# Patient Record
Sex: Male | Born: 1955 | Race: White | Hispanic: No | Marital: Married | Smoking: Never smoker
Health system: Southern US, Community
[De-identification: ages and names within clinical notes are randomized; demographics above are authoritative.]

## PROBLEM LIST (undated history)

## (undated) DIAGNOSIS — C4491 Basal cell carcinoma of skin, unspecified: Secondary | ICD-10-CM

## (undated) DIAGNOSIS — K635 Polyp of colon: Secondary | ICD-10-CM

## (undated) DIAGNOSIS — R9431 Abnormal electrocardiogram [ECG] [EKG]: Secondary | ICD-10-CM

## (undated) HISTORY — PX: VASECTOMY: SHX75

## (undated) HISTORY — PX: COLONOSCOPY: SHX174

## (undated) HISTORY — PX: KNEE ARTHROSCOPY: SUR90

## (undated) HISTORY — DX: Abnormal electrocardiogram (ECG) (EKG): R94.31

## (undated) HISTORY — DX: Polyp of colon: K63.5

## (undated) HISTORY — PX: HERNIA REPAIR: SHX51

## (undated) HISTORY — DX: Basal cell carcinoma of skin, unspecified: C44.91

---

## 1999-01-25 ENCOUNTER — Encounter: Payer: Self-pay | Admitting: Sports Medicine

## 1999-01-25 ENCOUNTER — Ambulatory Visit (HOSPITAL_COMMUNITY): Admission: RE | Admit: 1999-01-25 | Discharge: 1999-01-25 | Payer: Self-pay | Admitting: Sports Medicine

## 2006-06-07 ENCOUNTER — Ambulatory Visit: Payer: Self-pay | Admitting: Gastroenterology

## 2006-06-14 ENCOUNTER — Encounter: Payer: Self-pay | Admitting: Gastroenterology

## 2006-06-14 ENCOUNTER — Ambulatory Visit: Payer: Self-pay | Admitting: Gastroenterology

## 2006-06-29 ENCOUNTER — Ambulatory Visit: Payer: Self-pay | Admitting: Gastroenterology

## 2006-08-16 ENCOUNTER — Ambulatory Visit: Payer: Self-pay | Admitting: Family Medicine

## 2006-12-01 ENCOUNTER — Ambulatory Visit: Payer: Self-pay | Admitting: Gastroenterology

## 2006-12-14 ENCOUNTER — Ambulatory Visit: Payer: Self-pay | Admitting: Gastroenterology

## 2006-12-14 ENCOUNTER — Encounter (INDEPENDENT_AMBULATORY_CARE_PROVIDER_SITE_OTHER): Payer: Self-pay | Admitting: *Deleted

## 2006-12-14 LAB — HM COLONOSCOPY

## 2007-09-15 ENCOUNTER — Ambulatory Visit: Payer: Self-pay | Admitting: Family Medicine

## 2007-11-30 ENCOUNTER — Ambulatory Visit: Payer: Self-pay | Admitting: Internal Medicine

## 2007-11-30 DIAGNOSIS — Z9889 Other specified postprocedural states: Secondary | ICD-10-CM

## 2007-12-30 ENCOUNTER — Ambulatory Visit: Payer: Self-pay | Admitting: Internal Medicine

## 2008-01-04 LAB — CONVERTED CEMR LAB
ALT: 15 units/L (ref 0–53)
AST: 22 units/L (ref 0–37)
Albumin: 4.1 g/dL (ref 3.5–5.2)
Alkaline Phosphatase: 68 units/L (ref 39–117)
BUN: 12 mg/dL (ref 6–23)
Basophils Absolute: 0 10*3/uL (ref 0.0–0.1)
Basophils Relative: 0.7 % (ref 0.0–1.0)
Bilirubin, Direct: 0.1 mg/dL (ref 0.0–0.3)
CO2: 31 meq/L (ref 19–32)
Calcium: 9.4 mg/dL (ref 8.4–10.5)
Chloride: 107 meq/L (ref 96–112)
Cholesterol: 162 mg/dL (ref 0–200)
Creatinine, Ser: 0.9 mg/dL (ref 0.4–1.5)
Eosinophils Absolute: 0.1 10*3/uL (ref 0.0–0.6)
Eosinophils Relative: 2.5 % (ref 0.0–5.0)
GFR calc Af Amer: 114 mL/min
GFR calc non Af Amer: 95 mL/min
Glucose, Bld: 100 mg/dL — ABNORMAL HIGH (ref 70–99)
HCT: 46 % (ref 39.0–52.0)
HDL: 39.2 mg/dL (ref >39.0)
Hemoglobin: 15 g/dL (ref 13.0–17.0)
LDL Cholesterol: 111 mg/dL — ABNORMAL HIGH (ref 0–99)
Lymphocytes Relative: 49.2 % — ABNORMAL HIGH (ref 12.0–46.0)
MCHC: 32.5 g/dL (ref 30.0–36.0)
MCV: 97.1 fL (ref 78.0–100.0)
Monocytes Absolute: 0.4 10*3/uL (ref 0.2–0.7)
Monocytes Relative: 8.1 % (ref 3.0–11.0)
Neutro Abs: 1.9 10*3/uL (ref 1.4–7.7)
Neutrophils Relative %: 39.5 % — ABNORMAL LOW (ref 43.0–77.0)
PSA: 0.38 ng/mL (ref 0.10–4.00)
Platelets: 156 10*3/uL (ref 150–400)
Potassium: 4.5 meq/L (ref 3.5–5.1)
RBC: 4.74 M/uL (ref 4.22–5.81)
RDW: 12.4 % (ref 11.5–14.6)
Sodium: 142 meq/L (ref 135–145)
TSH: 2.08 microintl units/mL (ref 0.35–5.50)
Total Bilirubin: 0.8 mg/dL (ref 0.3–1.2)
Total CHOL/HDL Ratio: 4.1
Total Protein: 6.5 g/dL (ref 6.0–8.3)
Triglycerides: 58 mg/dL (ref 0–149)
VLDL: 12 mg/dL (ref 0–40)
WBC: 4.8 10*3/uL (ref 4.5–10.5)

## 2008-05-28 ENCOUNTER — Ambulatory Visit: Payer: Self-pay | Admitting: Internal Medicine

## 2008-05-28 DIAGNOSIS — H9209 Otalgia, unspecified ear: Secondary | ICD-10-CM | POA: Insufficient documentation

## 2008-06-04 ENCOUNTER — Telehealth (INDEPENDENT_AMBULATORY_CARE_PROVIDER_SITE_OTHER): Payer: Self-pay | Admitting: *Deleted

## 2009-01-29 ENCOUNTER — Ambulatory Visit: Payer: Self-pay | Admitting: Internal Medicine

## 2009-02-25 ENCOUNTER — Telehealth (INDEPENDENT_AMBULATORY_CARE_PROVIDER_SITE_OTHER): Payer: Self-pay | Admitting: *Deleted

## 2009-02-28 ENCOUNTER — Ambulatory Visit: Payer: Self-pay | Admitting: Internal Medicine

## 2009-08-23 ENCOUNTER — Ambulatory Visit: Payer: Self-pay | Admitting: Internal Medicine

## 2010-01-03 LAB — CONVERTED CEMR LAB
Blood Glucose, Fasting: 90 mg/dL
Cholesterol: 194 mg/dL
HDL: 47 mg/dL
Hgb A1c MFr Bld: 5.4 %
LDL Cholesterol: 134 mg/dL
Triglyceride fasting, serum: 65 mg/dL

## 2010-01-06 ENCOUNTER — Ambulatory Visit: Payer: Self-pay | Admitting: Internal Medicine

## 2010-01-13 LAB — CONVERTED CEMR LAB
ALT: 18 units/L (ref 0–53)
AST: 25 units/L (ref 0–37)
Albumin: 4.2 g/dL (ref 3.5–5.2)
Alkaline Phosphatase: 71 units/L (ref 39–117)
BUN: 14 mg/dL (ref 6–23)
Basophils Absolute: 0.2 10*3/uL — ABNORMAL HIGH (ref 0.0–0.1)
Basophils Relative: 3.5 % — ABNORMAL HIGH (ref 0.0–3.0)
Bilirubin, Direct: 0.1 mg/dL (ref 0.0–0.3)
CO2: 32 meq/L (ref 19–32)
Calcium: 9.4 mg/dL (ref 8.4–10.5)
Chloride: 106 meq/L (ref 96–112)
Creatinine, Ser: 1 mg/dL (ref 0.4–1.5)
Eosinophils Absolute: 0.2 10*3/uL (ref 0.0–0.7)
Eosinophils Relative: 3.4 % (ref 0.0–5.0)
GFR calc non Af Amer: 82.84 mL/min (ref >60)
Glucose, Bld: 80 mg/dL (ref 70–99)
HCT: 44.7 % (ref 39.0–52.0)
Hemoglobin: 14.8 g/dL (ref 13.0–17.0)
Lymphocytes Relative: 49.1 % — ABNORMAL HIGH (ref 12.0–46.0)
Lymphs Abs: 2.2 10*3/uL (ref 0.7–4.0)
MCHC: 33.2 g/dL (ref 30.0–36.0)
MCV: 97.9 fL (ref 78.0–100.0)
Monocytes Absolute: 0.9 10*3/uL (ref 0.1–1.0)
Monocytes Relative: 19.1 % — ABNORMAL HIGH (ref 3.0–12.0)
Neutro Abs: 1.2 10*3/uL — ABNORMAL LOW (ref 1.4–7.7)
Neutrophils Relative %: 24.9 % — ABNORMAL LOW (ref 43.0–77.0)
PSA: 0.44 ng/mL (ref 0.10–4.00)
Platelets: 148 10*3/uL — ABNORMAL LOW (ref 150.0–400.0)
Potassium: 4.4 meq/L (ref 3.5–5.1)
RBC: 4.56 M/uL (ref 4.22–5.81)
RDW: 12.5 % (ref 11.5–14.6)
Sodium: 142 meq/L (ref 135–145)
TSH: 1.49 microintl units/mL (ref 0.35–5.50)
Total Bilirubin: 0.7 mg/dL (ref 0.3–1.2)
Total Protein: 6.2 g/dL (ref 6.0–8.3)
WBC: 4.7 10*3/uL (ref 4.5–10.5)

## 2010-11-25 NOTE — Assessment & Plan Note (Signed)
Summary: CPX///SPH   Vital Signs:  Patient profile:   55 year old male Height:      70.25 inches Weight:      188.8 pounds BMI:     26.99 Pulse rate:   70 / minute BP sitting:   130 / 80  Vitals Entered By: Shary Decamp (January 06, 2010 2:25 PM) CC: cpx - not fasting, had labs drawn @ work last week, bp last week 122/71   History of Present Illness: CPX  Preventive Screening-Counseling & Management  Alcohol-Tobacco     Alcohol type: beer  Caffeine-Diet-Exercise     Caffeine use/day: 3+     Does Patient Exercise: yes     Times/week: 4      Drug Use:  no.    Current Medications (verified): 1)  None  Allergies (verified): 1)  ! Pcn  Past History:  Past Medical History: Reviewed history from 11/30/2007 and no changes required. colon polyps  Past Surgical History: HERNIORRHAPHY, HX OF (ICD-V45.89) ARTHROSCOPY, LEFT KNEE, HX OF -- age 31  Family History: CAD - no HTN - no stroke - no DM - no colon ca - no prostate ca - F (dx age in his early 48s) Mother 93, father 27 very active and healthy  Social History: Never Smoked Alcohol use-no Married 3 kids works for [employer] exercise-- gym, spinning x 5/week, swimms, runs mini-marathons  diet-- healthy Does Patient Exercise:  yes Drug Use:  no Caffeine use/day:  3+  Review of Systems General:  Denies fever; slightly  wt gain. CV:  Denies chest pain or discomfort and swelling of feet. Resp:  Denies cough and shortness of breath. GI:  Denies bloody stools. GU:  Denies dysuria, hematuria, and urinary hesitancy. Psych:  Denies anxiety and depression.  Physical Exam  General:  alert, well-developed, and well-nourished.   Neck:  no masses, no thyromegaly, and normal carotid upstroke.   Lungs:  normal respiratory effort, no intercostal retractions, no accessory muscle use, and normal breath sounds.   Heart:  normal rate, regular rhythm, no murmur, and no gallop.   Abdomen:  soft, non-tender, no distention, no  masses, no guarding, and no rigidity.   Rectal:  No external abnormalities noted. Normal sphincter tone. No rectal masses or tenderness. Prostate:  Prostate gland firm and smooth, no enlargement, nodularity, tenderness, mass, asymmetry or induration. Extremities:  no pretibial edema bilaterally  Psych:  Oriented X3, memory intact for recent and remote, normally interactive, good eye contact, not anxious appearing, and not depressed appearing.     Impression & Recommendations:  Problem # 1:  HEALTH SCREENING (ICD-V70.0) T d 2005 cont healthy life style!  FLP from work reviewed Cscope-- 2-08, polyp , Bx: no adenomatous changes      Orders: Venipuncture (16109) TLB-BMP (Basic Metabolic Panel-BMET) (80048-METABOL) TLB-CBC Platelet - w/Differential (85025-CBCD) TLB-TSH (Thyroid Stimulating Hormone) (84443-TSH) TLB-PSA (Prostate Specific Antigen) (84153-PSA) TLB-Hepatic/Liver Function Pnl (80076-HEPATIC)  Patient Instructions: 1)  Please schedule a follow-up appointment in 1 year.    Preventive Care Screening  Prior Values:    PSA:  0.38 (12/30/2007)    Colonoscopy:  polyp (12/14/2006)    Last Tetanus Booster:  Td (09/20/2004)    Risk Factors:  Tobacco use:  never Drug use:  no Caffeine use:  3+ drinks per day Alcohol use:  yes    Type:  beer    Comments:  3-4 beers a week Exercise:  yes    Times per week:  4  Colonoscopy History:  Date of Last Colonoscopy:  12/14/2006    Lipid Panel Test Date: 01/03/2010                        Value        Units        H/L   Reference  Cholesterol:          194          mg/dL              (161-096) LDL Cholesterol:      134          mg/dL              (04-540) HDL Cholesterol:      47           mg/dL              (98-11) Triglyceride:         65           mg/dL              (91-478) GNFA2Z                5.4                              Lab name:             labs drawn @ work      Chemistry Labs Test Date:  01/03/2010                      Value Units        H/L   Reference  Glucose-fasting:    90    mg/dL              (30-865)  Lab name:           drawn @ work

## 2011-03-13 NOTE — Assessment & Plan Note (Signed)
[facility]                           GASTROENTEROLOGY OFFICE NOTE   NAME:Christian Mcgrath, Christian Mcgrath                    MRN:          [id number]  DATE:06/29/2006                            DOB:          18-Mar-1956    [name] returns today for review of his colon biopsy.  He had some nodules in  his rectum removed, one of which showed a small carcinoid tumor.   I have spoken with him about the benign nature of small, localized rectal  carcinoids and will repeat his flexible sigmoidoscopy with sedation in  January.  He otherwise has no GI problems or issues and is followed closely  by Dr. Blossom Hoops.                                   Vania Rea. Jarold Motto, MD, [facility], [place]   DRP/MedQ  DD:  06/29/2006  DT:  06/30/2006  Job #:  [id number]   cc:   Leanne Chang, M.D.

## 2011-06-25 ENCOUNTER — Encounter: Payer: Self-pay | Admitting: *Deleted

## 2011-06-26 ENCOUNTER — Encounter: Payer: Self-pay | Admitting: Internal Medicine

## 2011-06-26 ENCOUNTER — Other Ambulatory Visit: Payer: Self-pay | Admitting: Internal Medicine

## 2011-06-26 ENCOUNTER — Ambulatory Visit (INDEPENDENT_AMBULATORY_CARE_PROVIDER_SITE_OTHER): Payer: BC Managed Care – PPO | Admitting: Internal Medicine

## 2011-06-26 DIAGNOSIS — Z0000 Encounter for general adult medical examination without abnormal findings: Secondary | ICD-10-CM | POA: Insufficient documentation

## 2011-06-26 NOTE — Progress Notes (Signed)
  Subjective:    Patient ID: Christian Mcgrath, male    DOB: [birth year], 55 y.o.   MRN: [id number]  HPI  CPX  Past Medical History  Diagnosis Date  . Colon polyps   . BCC (basal cell carcinoma of skin)     06-2010   Past Surgical History  Procedure Date  . Hernia repair     LEFT  . Knee arthroscopy     Left, age 16     Family History: CAD - no HTN - no stroke - no DM - no colon ca - no prostate ca - F (dx age in his early 82s) Mother 31, father 29 very active and healthy  (live in [place], play tennis, snow sky)  Social History: Never Smoked Alcohol use-socially  Married, 3 kids works for [employer] exercise-- gym, spinning x 5/week, swimms, runs, thriatlon diet-- healthy  Drug Use:  no Caffeine use/day:  3+  Review of Systems  Constitutional: Negative for fever, fatigue and unexpected weight change.  Respiratory: Negative for cough and shortness of breath.   Cardiovascular: Negative for chest pain and leg swelling.  Gastrointestinal: Negative for abdominal pain and blood in stool.  Genitourinary: Negative for dysuria, hematuria and difficulty urinating.  Psychiatric/Behavioral:       No depression, anxiety       Objective:   Physical Exam  Constitutional: He is oriented to person, place, and time. He appears well-developed and well-nourished. No distress.  HENT:  Head: Normocephalic and atraumatic.  Neck: No thyromegaly present.       Nl carotid pulses   Cardiovascular: Normal rate, regular rhythm and normal heart sounds.   No murmur heard. Pulmonary/Chest: Effort normal and breath sounds normal. No respiratory distress. He has no wheezes. He has no rales.  Abdominal: Soft. Bowel sounds are normal. He exhibits no distension. There is no tenderness. There is no rebound and no guarding.  Genitourinary: Rectum normal and prostate normal.  Musculoskeletal: He exhibits no edema.  Neurological: He is alert and oriented to person, place, and time.  Skin: Skin is warm and  dry. He is not diaphoretic.  Psychiatric: He has a normal mood and affect. His behavior is normal. Judgment and thought content normal.          Assessment & Plan:

## 2011-06-26 NOTE — Assessment & Plan Note (Addendum)
T d 2005 cont healthy life style!  FLP from work reviewed Cscope-- 2-08, polyp , Bx: no adenomatous changes Labs

## 2011-06-26 NOTE — Patient Instructions (Signed)
Came back fasting next week: FLP CMP CBC TSH PSA--- dx v70

## 2011-06-30 ENCOUNTER — Other Ambulatory Visit: Payer: BC Managed Care – PPO

## 2011-07-01 NOTE — Progress Notes (Signed)
Labs only

## 2011-07-06 ENCOUNTER — Other Ambulatory Visit: Payer: Self-pay | Admitting: Internal Medicine

## 2011-07-07 ENCOUNTER — Other Ambulatory Visit (INDEPENDENT_AMBULATORY_CARE_PROVIDER_SITE_OTHER): Payer: BC Managed Care – PPO

## 2011-07-07 DIAGNOSIS — Z0000 Encounter for general adult medical examination without abnormal findings: Secondary | ICD-10-CM

## 2011-07-07 LAB — COMPREHENSIVE METABOLIC PANEL
Albumin: 4.3 g/dL (ref 3.5–5.2)
Alkaline Phosphatase: 66 U/L (ref 39–117)
BUN: 19 mg/dL (ref 6–23)
CO2: 31 mEq/L (ref 19–32)
Calcium: 9.4 mg/dL (ref 8.4–10.5)
Chloride: 105 mEq/L (ref 96–112)
GFR: 103.58 mL/min (ref >60.00)
Glucose, Bld: 93 mg/dL (ref 70–99)
Potassium: 4.7 mEq/L (ref 3.5–5.1)
Sodium: 142 mEq/L (ref 135–145)
Total Protein: 6.8 g/dL (ref 6.0–8.3)

## 2011-07-07 LAB — CBC WITH DIFFERENTIAL/PLATELET
Basophils Relative: 1 % (ref 0.0–3.0)
Eosinophils Absolute: 0.1 10*3/uL (ref 0.0–0.7)
Eosinophils Relative: 3.1 % (ref 0.0–5.0)
Lymphocytes Relative: 45.2 % (ref 12.0–46.0)
MCV: 97.4 fl (ref 78.0–100.0)
Monocytes Absolute: 0.3 10*3/uL (ref 0.1–1.0)
Neutrophils Relative %: 43.9 % (ref 43.0–77.0)
Platelets: 163 10*3/uL (ref 150.0–400.0)
RBC: 4.84 Mil/uL (ref 4.22–5.81)
WBC: 4 10*3/uL — ABNORMAL LOW (ref 4.5–10.5)

## 2011-07-07 LAB — LIPID PANEL
HDL: 51.4 mg/dL (ref >39.00)
LDL Cholesterol: 127 mg/dL — ABNORMAL HIGH (ref 0–99)
Total CHOL/HDL Ratio: 4

## 2011-07-07 LAB — PSA: PSA: 0.37 ng/mL (ref 0.10–4.00)

## 2011-07-07 LAB — TSH: TSH: 3.82 u[IU]/mL (ref 0.35–5.50)

## 2011-07-07 NOTE — Progress Notes (Signed)
Labs only

## 2011-07-10 ENCOUNTER — Telehealth: Payer: Self-pay

## 2011-07-10 NOTE — Telephone Encounter (Signed)
Message copied by Beverely Low on Fri Jul 10, 2011  8:47 AM ------      Message from: Christian Mcgrath      Created: Thu Jul 09, 2011  5:03 PM       Advise patient:      His cholesterol is satisfactory, other labs within normal. Very good results.

## 2011-07-10 NOTE — Telephone Encounter (Signed)
Left message on personally identified voicemail to notify pt of results

## 2011-12-31 ENCOUNTER — Encounter: Payer: Self-pay | Admitting: Gastroenterology

## 2012-05-23 ENCOUNTER — Ambulatory Visit (INDEPENDENT_AMBULATORY_CARE_PROVIDER_SITE_OTHER): Payer: BC Managed Care – PPO | Admitting: Internal Medicine

## 2012-05-23 ENCOUNTER — Encounter: Payer: Self-pay | Admitting: Internal Medicine

## 2012-05-23 VITALS — BP 122/74 | HR 56 | Temp 98.7°F | Wt 173.0 lb

## 2012-05-23 DIAGNOSIS — H9209 Otalgia, unspecified ear: Secondary | ICD-10-CM

## 2012-05-23 MED ORDER — AZITHROMYCIN 250 MG PO TABS
ORAL_TABLET | ORAL | Status: AC
Start: 2012-05-23 — End: 2012-05-28

## 2012-05-23 MED ORDER — OFLOXACIN 0.3 % OT SOLN: 5.0000 [drp] | Freq: Two times a day (BID) | OTIC | Status: AC

## 2012-05-23 NOTE — Progress Notes (Signed)
  Subjective:    Patient ID: Christian Mcgrath, male    DOB: 1955/10/28, 56 y.o.   MRN: [id number]  HPI Acute visit Ear discomfort for the last 2 weeks, feels like water in the ear; he has tried to flush it with water without much improvement. [place] swimming [place].  Past Medical History  Diagnosis Date  . Colon polyps   . BCC (basal cell carcinoma of skin)     06-2010   Past Surgical History  Procedure Date  . Hernia repair     LEFT  . Knee arthroscopy     Left, age 76      Review of Systems Denies fever chills or URI symptoms. No actual ear discharge or bleeding.     Objective:   Physical Exam  General -- alert, well-developed, and overweight appearing. No apparent distress.  HEENT --  Right year normal Left ear: No external deformities, trago sign negative, canal without redness, but it was very sensitive to the otoscope. Tympanic membrane slightly red-bulge Psych-- Cognition and judgment appear intact. Alert and cooperative with normal attention span and concentration.  not anxious appearing and not depressed appearing.      Assessment & Plan:   Otalgia, He seems to have a middle ear infection, he is also very sensitive at the canal without obvious changes consistent with swimmer's ears. Plan: Z-Pak, topical antibiotics, to use ear plugs and call if not better in a couple weeks.

## 2012-05-23 NOTE — Patient Instructions (Addendum)
Call if no better in 2 weeks  

## 2012-06-29 ENCOUNTER — Ambulatory Visit (INDEPENDENT_AMBULATORY_CARE_PROVIDER_SITE_OTHER): Payer: BC Managed Care – PPO | Admitting: Internal Medicine

## 2012-06-29 ENCOUNTER — Encounter: Payer: Self-pay | Admitting: Internal Medicine

## 2012-06-29 VITALS — BP 112/74 | HR 48 | Temp 98.0°F | Ht 69.75 in | Wt 170.0 lb

## 2012-06-29 DIAGNOSIS — Z0000 Encounter for general adult medical examination without abnormal findings: Secondary | ICD-10-CM

## 2012-06-29 LAB — CBC WITH DIFFERENTIAL/PLATELET
Basophils Absolute: 0.1 10*3/uL (ref 0.0–0.1)
Eosinophils Absolute: 0.2 10*3/uL (ref 0.0–0.7)
HCT: 44.9 % (ref 39.0–52.0)
Hemoglobin: 15.1 g/dL (ref 13.0–17.0)
Lymphs Abs: 2.2 10*3/uL (ref 0.7–4.0)
MCHC: 33.6 g/dL (ref 30.0–36.0)
Monocytes Absolute: 0.4 10*3/uL (ref 0.1–1.0)
Neutro Abs: 1.8 10*3/uL (ref 1.4–7.7)
Platelets: 148 10*3/uL — ABNORMAL LOW (ref 150.0–400.0)
RDW: 13.4 % (ref 11.5–14.6)

## 2012-06-29 LAB — COMPREHENSIVE METABOLIC PANEL
ALT: 20 U/L (ref 0–53)
AST: 27 U/L (ref 0–37)
Chloride: 104 mEq/L (ref 96–112)
Creatinine, Ser: 1 mg/dL (ref 0.4–1.5)
Sodium: 139 mEq/L (ref 135–145)
Total Bilirubin: 0.9 mg/dL (ref 0.3–1.2)
Total Protein: 6.4 g/dL (ref 6.0–8.3)

## 2012-06-29 LAB — TSH: TSH: 1.77 u[IU]/mL (ref 0.35–5.50)

## 2012-06-29 NOTE — Progress Notes (Signed)
  Subjective:    Patient ID: Christian Mcgrath, male    DOB: [birth year], 56 y.o.   MRN: [id number]  HPI CPX In general feeling great, no complaints. Remains very active.   Past medical history Colon polyps BCC, 06-2010, sees derm  Past surgical history Left hernia repair Left knee scope  at age 30  Family History: CAD - no HTN - no stroke - no DM - no colon ca - no prostate ca - F (dx age in his early 15s) Mother 74, father 87 very active and healthy  (live in [place], play tennis, snow sky)  Social History: Never Smoked Alcohol use-socially   Married, 3 kids works for [employer] exercise-- gym, spinning x 5/week, swimms, runs, thriatlon diet-- healthy   Drug Use:  no    Review of Systems  Respiratory: Negative for cough and shortness of breath.   Cardiovascular: Negative for chest pain and leg swelling.  Gastrointestinal: Negative for abdominal pain and blood in stool.  Genitourinary: Negative for hematuria and difficulty urinating.  Psychiatric/Behavioral:       No depression or anxiety        Objective:   Physical Exam General -- alert, well-developed, and well-nourished.   Neck --no thyromegaly , normal carotid pulse Lungs -- normal respiratory effort, no intercostal retractions, no accessory muscle use, and normal breath sounds.   Heart-- normal rate, regular rhythm, no murmur, and no gallop.   Abdomen--soft, non-tender, no distention, no masses, no HSM, no guarding, and no rigidity.   Extremities-- no pretibial edema bilaterally Rectal-- No external abnormalities noted. Normal sphincter tone. Polyp like structure felt at the rectum, < 1cm (at 7 o'clock) , stools normal in color Prostate:  Prostate gland firm and smooth, no enlargement, nodularity, tenderness, mass, asymmetry or induration. Neurologic-- alert & oriented X3 and strength normal in all extremities. Psych-- Cognition and judgment appear intact. Alert and cooperative with normal attention span and  concentration.  not anxious appearing and not depressed appearing.       Assessment & Plan:

## 2012-06-29 NOTE — Assessment & Plan Note (Addendum)
T d 2005 cont healthy life style! Cscope-- 2-08, polyp; also I felt a polyp like structure during the DRE, patient aware of my finding; he already received a letter from GI recommended a colonoscopy and plans to do it this year. Labs  EKG, we'll discuss with cardiology

## 2012-06-29 NOTE — Patient Instructions (Signed)
You're doing great, continue with your healthy lifestyle. Definitely schedule a colonoscopy this year.

## 2012-07-14 ENCOUNTER — Other Ambulatory Visit: Payer: Self-pay | Admitting: Internal Medicine

## 2012-07-14 DIAGNOSIS — R9431 Abnormal electrocardiogram [ECG] [EKG]: Secondary | ICD-10-CM

## 2012-08-01 ENCOUNTER — Encounter: Payer: Self-pay | Admitting: Gastroenterology

## 2012-08-01 ENCOUNTER — Ambulatory Visit (AMBULATORY_SURGERY_CENTER): Payer: BC Managed Care – PPO | Admitting: *Deleted

## 2012-08-01 VITALS — Ht 70.0 in | Wt 173.7 lb

## 2012-08-01 DIAGNOSIS — Z1211 Encounter for screening for malignant neoplasm of colon: Secondary | ICD-10-CM

## 2012-08-01 DIAGNOSIS — Z86010 Personal history of colonic polyps: Secondary | ICD-10-CM

## 2012-08-01 MED ORDER — MOVIPREP 100 G PO SOLR: ORAL | Status: DC

## 2012-08-04 ENCOUNTER — Ambulatory Visit (INDEPENDENT_AMBULATORY_CARE_PROVIDER_SITE_OTHER): Payer: BC Managed Care – PPO | Admitting: Internal Medicine

## 2012-08-04 ENCOUNTER — Ambulatory Visit (INDEPENDENT_AMBULATORY_CARE_PROVIDER_SITE_OTHER): Payer: BC Managed Care – PPO | Admitting: Cardiovascular Disease

## 2012-08-04 ENCOUNTER — Encounter: Payer: Self-pay | Admitting: Internal Medicine

## 2012-08-04 ENCOUNTER — Encounter: Payer: Self-pay | Admitting: Cardiovascular Disease

## 2012-08-04 VITALS — BP 122/72 | HR 61 | Ht 70.0 in | Wt 171.2 lb

## 2012-08-04 VITALS — BP 138/62 | HR 66 | Resp 18 | Ht 69.0 in | Wt 173.0 lb

## 2012-08-04 DIAGNOSIS — I498 Other specified cardiac arrhythmias: Secondary | ICD-10-CM | POA: Insufficient documentation

## 2012-08-04 DIAGNOSIS — R9431 Abnormal electrocardiogram [ECG] [EKG]: Secondary | ICD-10-CM

## 2012-08-04 HISTORY — DX: Abnormal electrocardiogram (ECG) (EKG): R94.31

## 2012-08-04 NOTE — Assessment & Plan Note (Signed)
his electrocardiogram   is concerning for Brugada syndrome and is consistent with a type II pattern. In the absence of symptoms no further evaluation of this is normal as necessary.  The R. prime in right axis do raise the possibility of an ASD and we will plan to undertake an echo to look at right ventricular volumes and doable study

## 2012-08-04 NOTE — Patient Instructions (Addendum)
Your physician has requested that you have an echocardiogram bubble study. Echocardiography is a painless test that uses sound waves to create images of your heart. It provides your doctor with information about the size and shape of your heart and how well your heart's chambers and valves are working. This procedure takes approximately one hour. There are no restrictions for this procedure.

## 2012-08-04 NOTE — Progress Notes (Signed)
ELECTROPHYSIOLOGY CONSULT NOTE  Patient ID: Christian Mcgrath, MRN: [id number], DOB/AGE: February 20, 1956 56 y.o. Admit date: (Not on file) Date of Consult: 08/04/2012  Primary Physician: Willow Ora, MD Primary Cardiologist: PN  Chief Complaint: abnormal ECG    HPI Christian Mcgrath is a 56 y.o. male seen at the request of Dr. Jamse Mead because of an abnormal ECG. He is a very vigorous gentleman who exercises doing tampons and cannonball runs.  He's had no exercise limitations.  He has a history of syncope which occurred while he was lifting weights with Valsalva. There is no other syncope which he is aware. His family history is negative as much as he is aware.  Past Medical History  Diagnosis Date  . Colon polyps   . BCC (basal cell carcinoma of skin)     06-2010      Surgical History:  Past Surgical History  Procedure Date  . Hernia repair     LEFT  . Knee arthroscopy     Left, age 32  . Colonoscopy   . Mole removal      Home Meds: Prior to Admission medications   Medication Sig Start Date End Date Taking? Authorizing Provider  MOVIPREP 100 G SOLR Take as directed. 08/01/12  Yes Mardella Layman, MD    Allergies:  Allergies  Allergen Reactions  . Penicillins Other (See Comments)    "never had,Dad had reaction"    History   Social History  . Marital Status: Married    Spouse Name: N/A    Number of Children: 3  . Years of Education: N/A   Occupational History  . Not on file.   Social History Main Topics  . Smoking status: Never Smoker   . Smokeless tobacco: Never Used  . Alcohol Use: 2.4 oz/week    4 Cans of beer per week     4 beers weekends  . Drug Use: No  . Sexually Active: Not on file   Other Topics Concern  . Not on file   Social History Narrative   Works for Exercise-gym, spinning 5x week, swims, runs mini-marathonsMother 77, Father 80; very active and healthy     Family History  Problem Relation Age of Onset  . Coronary artery disease  Neg Hx   . Hypertension Neg Hx   . Stroke Neg Hx   . Diabetes Neg Hx   . Colon cancer Neg Hx   . Prostate cancer Father     Dx age in early 24s     ROS:  Please see the history of present illness.    All other systems reviewed and negative.    Physical Exam:  Blood pressure 122/72, pulse 61, height 5\' 10"  (1.778 m), weight 171 lb 3.2 oz (77.656 kg). General: Well developed, well nourished male in no acute distress. Head: Normocephalic, atraumatic, sclera non-icteric, no xanthomas, nares are without discharge. Lymph Nodes:  none Back: without scoliosis/kyphosis, no CVA tendersness Neck: Negative for carotid bruits. JVD not elevated. Lungs: Clear bilaterally to auscultation without wheezes, rales, or rhonchi. Breathing is unlabored. Heart: RRR with S1 S2 which is somewhat widely split but seems to move with respiration o murmur , rubs, or gallops appreciated. Abdomen: Soft, non-tender, non-distended with normoactive bowel sounds. No hepatomegaly. No rebound/guarding. No obvious abdominal masses. Msk:  Strength and tone appear normal for age. Extremities: No clubbing or cyanosis. No edema.  Distal pedal pulses are 2+ and equal bilaterally. Skin: Warm and Dry Neuro: Alert and  oriented X 3. CN III-XII intact Grossly normal sensory and motor function . Psych:  Responds to questions appropriately with a normal affect.      Labs: Cardiac Enzymes No results found for this basename: CKTOTAL:4,CKMB:4,TROPONINI:4 in the last 72 hours CBC Lab Results  Component Value Date   WBC 4.7 06/29/2012   HGB 15.1 06/29/2012   HCT 44.9 06/29/2012   MCV 96.4 06/29/2012   PLT 148.0* 06/29/2012   PROTIME: No results found for this basename: LABPROT:3,INR:3 in the last 72 hours Chemistry No results found for this basename: NA,K,CL,CO2,BUN,CREATININE,CALCIUM,LABALBU,PROT,BILITOT,ALKPHOS,ALT,AST,GLUCOSE in the last 168 hours Lipids Lab Results  Component Value Date   CHOL 169 06/29/2012   HDL 46.20 06/29/2012     LDLCALC 110* 06/29/2012   TRIG 64.0 06/29/2012   BNP No results found for this basename: probnp   Miscellaneous No results found for this basename: DDIMER    Radiology/Studies:  No results found.  EKG: Sinus rhythm at 55 intervals 15/10/45 there is an RSR prime in lead V1 with right axis deviation. There is ST segment elevation in a coved pattern in lead V1 but it is back in lead V2. Electrocardiogram was repeated with V1 and V2 placement to the second intercostal space. We continued headache pattern in lead V1 and a setback pattern in lead V2  Assessment and Plan: Christian Mcgrath

## 2012-08-04 NOTE — Assessment & Plan Note (Signed)
Discussed case with Dr Graciela Husbands EPS who will see him this afternoon.  Given the lack of family history and symptoms with more normalization of ECG with V12 in 2nd intercostal space not sure any AICD or w/u needed.  ? Dobutamine with monitoring or isuprel to assess for ECG changes Will see Brett Canales this afternoon

## 2012-08-04 NOTE — Patient Instructions (Addendum)
Your physician recommends that you schedule a follow-up appointment in: PT TO SEE DR Graciela Husbands THIS AFTERNOON AT 3:30 PM Your physician recommends that you continue on your current medications as directed. Please refer to the Current Medication list given to you today.

## 2012-08-04 NOTE — Progress Notes (Signed)
Patient ID: Christian Mcgrath, male   DOB: 07/10/1956, 56 y.o.   MRN: [id number] 56 yo referred by Dr Drue Novel for abnormal ECG.  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  Patient is asymptomatic.  He does cannonball runs and triathalons.  His only episode of "syncope" was while lifting weights in college many years ago with valsalva maneuvers.  No family history of sudden death or syncope.  He is extremely active with no chest pain palpitations or dyspnea.  Has never been documented with heart problem before  ROS: Denies fever, malais, weight loss, blurry vision, decreased visual acuity, cough, sputum, SOB, hemoptysis, pleuritic pain, palpitaitons, heartburn, abdominal pain, melena, lower extremity edema, claudication, or rash.  All other systems reviewed and negative   General: Affect appropriate Healthy:  appears stated age HEENT: normal Neck supple with no adenopathy JVP normal no bruits no thyromegaly Lungs clear with no wheezing and good diaphragmatic motion Heart:  S1/S2 no murmur,rub, gallop or click PMI normal Abdomen: benighn, BS positve, no tenderness, no AAA no bruit.  No HSM or HJR Distal pulses intact with no bruits No edema Neuro non-focal Skin warm and dry No muscular weakness  Medications Current Outpatient Prescriptions  Medication Sig Dispense Refill  . MOVIPREP 100 G SOLR Take as directed.  1 kit  0    Allergies Penicillins  Family History: Family History  Problem Relation Age of Onset  . Coronary artery disease Neg Hx   . Hypertension Neg Hx   . Stroke Neg Hx   . Diabetes Neg Hx   . Colon cancer Neg Hx   . Prostate cancer Father     Dx age in early 23s    Social History: History   Social History  . Marital Status: Married    Spouse Name: N/A    Number of Children: 3  . Years of Education: N/A   Occupational History  . Not on file.   Social History Main Topics  . Smoking status: Never Smoker   . Smokeless tobacco:  Never Used  . Alcohol Use: 2.4 oz/week    4 Cans of beer per week     4 beers weekends  . Drug Use: No  . Sexually Active: Not on file   Other Topics Concern  . Not on file   Social History Narrative   Works for Exercise-gym, spinning 5x week, swims, runs mini-marathonsMother 77, Father 80; very active and healthy    Electrocardiogram:  See HPI  ECG repeated today with leads V1 and V2 in second intercostal space to try to bring out type one brugada changes and ECG looked more normal with no downsoping ascending limb of T wave.  Rate 59 otherwise normal  Assessment and Plan

## 2012-08-08 ENCOUNTER — Encounter (HOSPITAL_COMMUNITY): Payer: Self-pay

## 2012-08-08 ENCOUNTER — Ambulatory Visit (HOSPITAL_COMMUNITY): Payer: BC Managed Care – PPO | Attending: Internal Medicine | Admitting: Radiology

## 2012-08-08 DIAGNOSIS — I369 Nonrheumatic tricuspid valve disorder, unspecified: Secondary | ICD-10-CM | POA: Insufficient documentation

## 2012-08-08 DIAGNOSIS — R9431 Abnormal electrocardiogram [ECG] [EKG]: Secondary | ICD-10-CM

## 2012-08-08 DIAGNOSIS — I059 Rheumatic mitral valve disease, unspecified: Secondary | ICD-10-CM | POA: Insufficient documentation

## 2012-08-08 NOTE — Progress Notes (Signed)
Echocardiogram with a Bubble Study performed.

## 2012-08-08 NOTE — Progress Notes (Signed)
Patient ID: Christian Mcgrath, male   DOB: 24-Oct-1956, 57 y.o.   MRN: [id number] IV with 20 G angiocath (R) AC for Echo Bubble Study, tolerated well, site unremarkable. Irean Hong, RN.

## 2012-08-15 ENCOUNTER — Telehealth: Payer: Self-pay | Admitting: Gastroenterology

## 2012-08-15 ENCOUNTER — Encounter: Payer: BC Managed Care – PPO | Admitting: Gastroenterology

## 2012-08-24 ENCOUNTER — Encounter: Payer: BC Managed Care – PPO | Admitting: Gastroenterology

## 2012-08-31 ENCOUNTER — Encounter: Payer: Self-pay | Admitting: Gastroenterology

## 2012-08-31 ENCOUNTER — Ambulatory Visit (AMBULATORY_SURGERY_CENTER): Payer: BC Managed Care – PPO | Admitting: Gastroenterology

## 2012-08-31 VITALS — BP 110/70 | HR 49 | Temp 97.3°F | Resp 22 | Ht 70.0 in | Wt 173.0 lb

## 2012-08-31 DIAGNOSIS — D126 Benign neoplasm of colon, unspecified: Secondary | ICD-10-CM

## 2012-08-31 DIAGNOSIS — Z1211 Encounter for screening for malignant neoplasm of colon: Secondary | ICD-10-CM

## 2012-08-31 DIAGNOSIS — Z86010 Personal history of colonic polyps: Secondary | ICD-10-CM

## 2012-08-31 MED ORDER — FLEET ENEMA 7-19 GM/118ML RE ENEM: 1.0000 | ENEMA | Freq: Once | RECTAL | Status: DC

## 2012-08-31 MED ORDER — SODIUM CHLORIDE 0.9 % IV SOLN: 500.0000 mL | INTRAVENOUS | Status: DC

## 2012-08-31 NOTE — Op Note (Signed)
 [facility] [address] [place], [address]   COLONOSCOPY PROCEDURE REPORT  PATIENT: Christian Mcgrath, Christian Mcgrath  MR#: [id number] BIRTHDATE: 06-22-1956 , 56  yrs. old GENDER: Male ENDOSCOPIST: Mardella Layman, MD, [facility] REFERRED BY: PROCEDURE DATE:  08/31/2012 PROCEDURE:   Colonoscopy with snare polypectomy ASA CLASS:   Class II INDICATIONS:patient's personal history of adenomatous colon polyps.  MEDICATIONS: propofol (Diprivan) 300mg  IV  DESCRIPTION OF PROCEDURE:   After the risks and benefits and of the procedure were explained, informed consent was obtained.        The LB [id number]  endoscope was introduced through the anus and advanced to the cecum, which was identified by both the appendix and ileocecal valve .  The quality of the prep was excellent, using MoviPrep .  The instrument was then slowly withdrawn as the colon was fully examined.     COLON FINDINGS: A normal appearing cecum, ileocecal valve, and appendiceal orifice were identified.  The ascending, hepatic flexure, transverse, splenic flexure, descending, sigmoid colon and rectum appeared unremarkable.  No polyps or cancers were seen.   A smooth flat polyp ranging between 3-3mm in size was found in the sigmoid colon.  A polypectomy was performed using snare cautery. Retroflexed views revealed no abnormalities.     The scope was then withdrawn from the patient and the procedure completed.  COMPLICATIONS: There were no complications. ENDOSCOPIC IMPRESSION: 1.   Normal colon ...no rectal carcinoid. 2.   Flat polyp ranging between 3-98mm in size was found in the sigmoid colon; polypectomy was performed using snare cautery ,r/op adenoma  RECOMMENDATIONS: 1.  Await biopsy results 2.  Repeat colonoscopy in 5 years if polyp adenomatous; otherwise 10 years   REPEAT EXAM:  ON:GEXB Paz, MD  _______________________________ eSigned:  Mardella Layman, MD, [facility] 08/31/2012 9:10  AM

## 2012-08-31 NOTE — Progress Notes (Signed)
Phone mess left by [facility] to Dr Jarold Motto. Pt states he is still having brown stool with bowel movements. Fleets enema given.

## 2012-08-31 NOTE — Patient Instructions (Signed)
YOU HAD AN ENDOSCOPIC PROCEDURE TODAY AT THE [facility]: Refer to the procedure report that was given to you for any specific questions about what was found during the examination.  If the procedure report does not answer your questions, please call your gastroenterologist to clarify.  If you requested that your care partner not be given the details of your procedure findings, then the procedure report has been included in a sealed envelope for you to review at your convenience later.  YOU SHOULD EXPECT: Some feelings of bloating in the abdomen. Passage of more gas than usual.  Walking can help get rid of the air that was put into your GI tract during the procedure and reduce the bloating. If you had a lower endoscopy (such as a colonoscopy or flexible sigmoidoscopy) you may notice spotting of blood in your stool or on the toilet paper. If you underwent a bowel prep for your procedure, then you may not have a normal bowel movement for a few days.  DIET: Your first meal following the procedure should be a light meal and then it is ok to progress to your normal diet.  A half-sandwich or bowl of soup is an example of a good first meal.  Heavy or fried foods are harder to digest and may make you feel nauseous or bloated.  Likewise meals heavy in dairy and vegetables can cause extra gas to form and this can also increase the bloating.  Drink plenty of fluids but you should avoid alcoholic beverages for 24 hours.  ACTIVITY: Your care partner should take you home directly after the procedure.  You should plan to take it easy, moving slowly for the rest of the day.  You can resume normal activity the day after the procedure however you should NOT DRIVE or use heavy machinery for 24 hours (because of the sedation medicines used during the test).    SYMPTOMS TO REPORT IMMEDIATELY: A gastroenterologist can be reached at any hour.  During normal business hours, 8:30 AM to 5:00 PM Monday through Friday,  call [phone].  After hours and on weekends, please call the GI answering service at [phone] who will take a message and have the physician on call contact you.   Following lower endoscopy (colonoscopy or flexible sigmoidoscopy):  Excessive amounts of blood in the stool  Significant tenderness or worsening of abdominal pains  Swelling of the abdomen that is new, acute  Fever of 100F or higher    FOLLOW UP: If any biopsies were taken you will be contacted by phone or by letter within the next 1-3 weeks.  Call your gastroenterologist if you have not heard about the biopsies in 3 weeks.  Our staff will call the home number listed on your records the next business day following your procedure to check on you and address any questions or concerns that you may have at that time regarding the information given to you following your procedure. This is a courtesy call and so if there is no answer at the home number and we have not heard from you through the emergency physician on call, we will assume that you have returned to your regular daily activities without incident.  SIGNATURES/CONFIDENTIALITY: You and/or your care partner have signed paperwork which will be entered into your electronic medical record.  These signatures attest to the fact that that the information above on your After Visit Summary has been reviewed and is understood.  Full responsibility of the confidentiality   of this discharge information lies with you and/or your care-partner.    Information on polyps given to you today 

## 2012-08-31 NOTE — Progress Notes (Signed)
Patient did not experience any of the following events: a burn prior to discharge; a fall within the facility; wrong site/side/patient/procedure/implant event; or a hospital transfer or hospital admission upon discharge from the facility. (G8907) Patient did not have preoperative order for IV antibiotic SSI prophylaxis. (G8918)  

## 2012-09-01 ENCOUNTER — Telehealth: Payer: Self-pay

## 2012-09-01 NOTE — Telephone Encounter (Signed)
  Follow up Call-  Call back number 08/31/2012  Post procedure Call Back phone  # [phone]  Permission to leave phone message Yes     Patient questions:  Do you have a fever, pain , or abdominal swelling? no Pain Score  0 *  Have you tolerated food without any problems? yes  Have you been able to return to your normal activities? yes  Do you have any questions about your discharge instructions: Diet   no Medications  no Follow up visit  no  Do you have questions or concerns about your Care? no  Actions: * If pain score is 4 or above: No action needed, pain <4.

## 2012-09-06 ENCOUNTER — Encounter: Payer: Self-pay | Admitting: Gastroenterology

## 2012-09-14 NOTE — Telephone Encounter (Signed)
Phone call complete

## 2012-12-10 ENCOUNTER — Other Ambulatory Visit: Payer: Self-pay

## 2013-02-20 ENCOUNTER — Telehealth: Payer: Self-pay | Admitting: Internal Medicine

## 2013-02-20 NOTE — Telephone Encounter (Signed)
Please advise 

## 2013-02-20 NOTE — Telephone Encounter (Signed)
lmovm for pt to return call.  

## 2013-02-20 NOTE — Telephone Encounter (Signed)
Call patient, if he had a rash around the tick bite, he needs to be seen. Also to call if he has aches, pains, fevers.

## 2013-02-20 NOTE — Telephone Encounter (Signed)
Patient Information:  Caller Name: Konstantinos  Phone: [phone]  Patient: Christian Mcgrath, Christian Mcgrath  Gender: Male  DOB: [birth year]  Age: 56 Years  PCP: Willow Ora  Office Follow Up:  Does the office need to follow up with this patient?: No  Instructions For The Office: N/A  RN Note:  Patient got tick on left wrist; states was able to remove the entire tick, but onset of redness 02/20/13.  Emergent symptoms per tick bite protocol; home care advised, with callback parameters given.  krs/can  Symptoms  Reason For Call & Symptoms: tick bite  Reviewed Health History In EMR: Yes  Reviewed Medications In EMR: Yes  Reviewed Allergies In EMR: Yes  Reviewed Surgeries / Procedures: Yes  Date of Onset of Symptoms: 02/19/2013  Guideline(s) Used:  Tick Bite  Disposition Per Guideline:   Home Care  Reason For Disposition Reached:   Tick bite with no complications  Advice Given:  Antibiotic Ointment:  Wash the wound and your hands with soap and water after removal to prevent catching any tick disease. Apply an over-the-counter antibiotic ointment (e.g., bacitracin) to the bite once.  Expected Course:  Tick bites normally do not itch or hurt. That is why they often go unnoticed.  Call Back If:  Fever or rash occur in the next 2 weeks  Bite begins to look infected  You become worse.  Patient Will Follow Care Advice:  YES

## 2013-08-31 ENCOUNTER — Other Ambulatory Visit: Payer: Self-pay

## 2013-10-16 ENCOUNTER — Telehealth: Payer: Self-pay

## 2013-10-16 NOTE — Telephone Encounter (Signed)
Medication List and allergies:  Reviewed and updated  90 day supply/mail order: na  Local prescriptions: [facility] [address]  Immunizations due: UTD  A/P:   No changes to FH or PSH or personal hx Tdap--08/2004 [facility]--08/2012--Dr Patterson--next due 2023 PSA--06/2012--0.48 Torn hamstring over summer--still in PT  To Discuss with Provider: Not at this time

## 2013-10-17 ENCOUNTER — Ambulatory Visit (INDEPENDENT_AMBULATORY_CARE_PROVIDER_SITE_OTHER): Payer: BC Managed Care – PPO | Admitting: Internal Medicine

## 2013-10-17 ENCOUNTER — Encounter: Payer: Self-pay | Admitting: Internal Medicine

## 2013-10-17 VITALS — BP 132/67 | HR 60 | Temp 98.3°F | Ht 70.0 in | Wt 181.0 lb

## 2013-10-17 DIAGNOSIS — R9431 Abnormal electrocardiogram [ECG] [EKG]: Secondary | ICD-10-CM

## 2013-10-17 DIAGNOSIS — Z0000 Encounter for general adult medical examination without abnormal findings: Secondary | ICD-10-CM

## 2013-10-17 NOTE — Assessment & Plan Note (Signed)
T d 2005 Had a flu shot zostavax discussed  cont healthy life style! Cscope-- 2-08, polyp;  colonoscopy again 08-2012 (-), 10 years. Labs

## 2013-10-17 NOTE — Patient Instructions (Signed)
Please  schedule labs : CMP, CBC, TSH, PSA, FLP---- dx V70  Next visit for a physical exam   fasting, in 1 year Please make an appointment

## 2013-10-17 NOTE — Assessment & Plan Note (Signed)
asx ECHO was wnl EKG today with similar changes

## 2013-10-17 NOTE — Progress Notes (Signed)
Pre visit review using our clinic review tool, if applicable. No additional management support is needed unless otherwise documented below in the visit note. 

## 2013-10-17 NOTE — Progress Notes (Signed)
   Subjective:    Patient ID: Christian Mcgrath, male    DOB: 03-28-1956, 57 y.o.   MRN: [id number]  HPI CPX   Past Medical History  Diagnosis Date  . Colon polyps   . BCC (basal cell carcinoma of skin)     06-2010, sees derm  . Abnormal ECG 08/04/2012   Past Surgical History  Procedure Laterality Date  . Hernia repair      LEFT  . Knee arthroscopy      Left, age 60  . Colonoscopy     History   Social History  . Marital Status: Married    Spouse Name: N/A    Number of Children: 3  . Years of Education: N/A   Occupational History  . 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 Smoking status: Never Smoker   . Smokeless tobacco: Never Used  . Alcohol Use: 2.4 oz/week    4 Cans of beer per week     Comment: 4 beers weekends  . Drug Use: No  . Sexual Activity: Not on file   Other Topics Concern  . Not on file   Social History Narrative   Works for [employer]       Mother 62, Father 49; very active and healthy               Family History  Problem Relation Age of Onset  . Coronary artery disease Neg Hx   . Hypertension Neg Hx   . Stroke Neg Hx   . Diabetes Neg Hx   . Colon cancer Neg Hx   . Prostate cancer Father     Dx age in early 79s  . Cancer Father     oral, not a smoker    Review of Systems Diet-- mostly healthy Exercise-- extremely  active  No  CP, SOB, no syncope  Denies  nausea, vomiting diarrhea Denies  blood in the stools (-) cough, sputum production No dysuria, gross hematuria, difficulty urinating   No anxiety, depression      Objective:   Physical Exam BP 132/67  Pulse 60  Temp(Src) 98.3 F (36.8 C)  Ht 5\' 10"  (1.778 m)  Wt 181 lb (82.101 kg)  BMI 25.97 kg/m2  SpO2 98% General -- alert, well-developed, NAD.  Neck --no thyromegaly  HEENT-- Not pale.  Lungs -- normal respiratory effort, no intercostal retractions, no accessory muscle use, and normal breath sounds.  Heart-- normal rate, regular rhythm, no murmur.  Abdomen-- Not  distended, good bowel sounds,soft, non-tender. Rectal-- No external abnormalities noted. Normal sphincter tone. No rectal masses or tenderness. Brown stool  Prostate--Prostate gland firm and smooth, no enlargement, nodularity, tenderness, mass, asymmetry or induration. Extremities-- no pretibial edema bilaterally  Neurologic--  alert & oriented X3. Speech normal, gait normal  Psych-- Cognition and judgment appear intact. Cooperative with normal attention span and concentration. No anxious appearing , no depressed appearing.      Assessment & Plan:

## 2013-10-20 ENCOUNTER — Other Ambulatory Visit (INDEPENDENT_AMBULATORY_CARE_PROVIDER_SITE_OTHER): Payer: BC Managed Care – PPO

## 2013-10-20 DIAGNOSIS — Z0000 Encounter for general adult medical examination without abnormal findings: Secondary | ICD-10-CM

## 2013-10-20 LAB — CBC WITH DIFFERENTIAL/PLATELET
Basophils Relative: 1 % (ref 0.0–3.0)
Eosinophils Relative: 5.4 % — ABNORMAL HIGH (ref 0.0–5.0)
Lymphocytes Relative: 45.6 % (ref 12.0–46.0)
MCV: 95.7 fl (ref 78.0–100.0)
Monocytes Absolute: 0.5 10*3/uL (ref 0.1–1.0)
Monocytes Relative: 9.9 % (ref 3.0–12.0)
Neutrophils Relative %: 38.1 % — ABNORMAL LOW (ref 43.0–77.0)
RBC: 4.74 Mil/uL (ref 4.22–5.81)
WBC: 5.5 10*3/uL (ref 4.5–10.5)

## 2013-10-20 LAB — LIPID PANEL
Cholesterol: 207 mg/dL — ABNORMAL HIGH (ref 0–200)
HDL: 45.3 mg/dL (ref >39.00)
Triglycerides: 100 mg/dL (ref 0.0–149.0)
VLDL: 20 mg/dL (ref 0.0–40.0)

## 2013-10-20 LAB — COMPREHENSIVE METABOLIC PANEL
Albumin: 4.1 g/dL (ref 3.5–5.2)
Alkaline Phosphatase: 93 U/L (ref 39–117)
BUN: 15 mg/dL (ref 6–23)
Glucose, Bld: 91 mg/dL (ref 70–99)
Potassium: 4.4 mEq/L (ref 3.5–5.1)

## 2013-10-20 LAB — PSA: PSA: 0.41 ng/mL (ref 0.10–4.00)

## 2014-01-21 ENCOUNTER — Telehealth: Payer: Self-pay | Admitting: Internal Medicine

## 2014-01-21 NOTE — Telephone Encounter (Signed)
Advise patient, needs labs. TSH, free T3, free T4 ------------> DX abnormal TSH

## 2014-01-23 NOTE — Telephone Encounter (Signed)
Pt scheduled 01/25/14

## 2014-01-25 ENCOUNTER — Other Ambulatory Visit (INDEPENDENT_AMBULATORY_CARE_PROVIDER_SITE_OTHER): Payer: BC Managed Care – PPO

## 2014-01-25 DIAGNOSIS — R946 Abnormal results of thyroid function studies: Secondary | ICD-10-CM

## 2014-01-25 DIAGNOSIS — R350 Frequency of micturition: Secondary | ICD-10-CM

## 2014-01-25 DIAGNOSIS — R7989 Other specified abnormal findings of blood chemistry: Secondary | ICD-10-CM

## 2014-01-25 LAB — TSH: TSH: 1.09 u[IU]/mL (ref 0.35–5.50)

## 2014-01-25 LAB — T4, FREE: Free T4: 0.77 ng/dL (ref 0.60–1.60)

## 2014-01-25 LAB — T3, FREE: T3, Free: 3 pg/mL (ref 2.3–4.2)

## 2014-01-29 ENCOUNTER — Encounter: Payer: Self-pay | Admitting: Internal Medicine

## 2014-01-29 ENCOUNTER — Ambulatory Visit (INDEPENDENT_AMBULATORY_CARE_PROVIDER_SITE_OTHER): Payer: BC Managed Care – PPO | Admitting: Internal Medicine

## 2014-01-29 VITALS — BP 118/73 | HR 80 | Temp 97.9°F | Wt 182.0 lb

## 2014-01-29 DIAGNOSIS — K649 Unspecified hemorrhoids: Secondary | ICD-10-CM

## 2014-01-29 NOTE — Progress Notes (Signed)
   Subjective:    Patient ID: Christian Mcgrath, male    DOB: Jul 05, 1956, 58 y.o.   MRN: [id number]  DOS:  01/29/2014 Type of  visit: Acute visit   3 years history of on and off anorectal pain, mostly with bowel movements, feels like a sharp, "paper cut" type of  pain. No bleeding or itching. Using Preparation H and other OTCs with some help.   ROS Denies nausea, vomiting, diarrhea.No constipation. No blood in the stools Occasional, feels a anal spasm  Past Medical History  Diagnosis Date  . Colon polyps   . BCC (basal cell carcinoma of skin)     06-2010, sees derm  . Abnormal ECG 08/04/2012    Past Surgical History  Procedure Laterality Date  . Hernia repair      LEFT  . Knee arthroscopy      Left, age 79  . Colonoscopy      History   Social History  . Marital Status: Married    Spouse Name: N/A    Number of Children: 3  . 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 Smoking status: Never Smoker   . Smokeless tobacco: Never Used  . Alcohol Use: 2.4 oz/week    4 Cans of beer per week     Comment: 4 beers weekends  . Drug Use: No  . Sexual Activity: Not on file   Other Topics Concern  . Not on file   Social History Narrative   Works for [employer]       Mother 77, Father 74; very active and healthy                    Medication List    Notice As of 01/29/2014  9:14 PM   You have not been prescribed any medications.         Objective:   Physical Exam BP 118/73  Pulse 80  Temp(Src) 97.9 F (36.6 C)  Wt 182 lb (82.555 kg)  SpO2 98% General -- alert, well-developed, NAD.  DRE-- + External hemorrhoids without sign of bleeding. No mass, brown stools Anoscopy + Internal hemorrhoids, several, no active bleeding.  Neurologic--  alert & oriented X3. Speech normal, gait normal, strength normal in all extremities.    Psych-- Cognition and judgment appear intact. Cooperative with normal attention span  and concentration. No anxious or depressed appearing.        Assessment & Plan:

## 2014-01-29 NOTE — Assessment & Plan Note (Addendum)
Discussed with the patient what hemorrhoids are, he also seems to be having annal spasms. Had a colonoscopy 11/2011, no hemorrhoids were noted  at that time Plan: Metamucil, nupercainal, diltiazem ointment (paper Rx) If no better patient will let me know.

## 2014-01-29 NOTE — Progress Notes (Signed)
Pre visit review using our clinic review tool, if applicable. No additional management support is needed unless otherwise documented below in the visit note. 

## 2014-01-29 NOTE — Patient Instructions (Signed)
Metamucil 2 capsules daily Use baby wipes. If pain: Nupercainal ointment OTC Diltiazem 2% ointment topically as needed. Call if not improving or if symptoms get more frequent.   Hemorrhoids Hemorrhoids are swollen veins around the rectum or anus. There are two types of hemorrhoids:   Internal hemorrhoids. These occur in the veins just inside the rectum. They may poke through to the outside and become irritated and painful.  External hemorrhoids. These occur in the veins outside the anus and can be felt as a painful swelling or hard lump near the anus. CAUSES  Pregnancy.   Obesity.   Constipation or diarrhea.   Straining to have a bowel movement.   Sitting for long periods on the toilet.  Heavy lifting or other activity that caused you to strain.  Anal intercourse. SYMPTOMS   Pain.   Anal itching or irritation.   Rectal bleeding.   Fecal leakage.   Anal swelling.   One or more lumps around the anus.  DIAGNOSIS  Your caregiver may be able to diagnose hemorrhoids by visual examination. Other examinations or tests that may be performed include:   Examination of the rectal area with a gloved hand (digital rectal exam).   Examination of anal canal using a small tube (scope).   A blood test if you have lost a significant amount of blood.  A test to look inside the colon (sigmoidoscopy or colonoscopy). TREATMENT Most hemorrhoids can be treated at home. However, if symptoms do not seem to be getting better or if you have a lot of rectal bleeding, your caregiver may perform a procedure to help make the hemorrhoids get smaller or remove them completely. Possible treatments include:   Placing a rubber band at the base of the hemorrhoid to cut off the circulation (rubber band ligation).   Injecting a chemical to shrink the hemorrhoid (sclerotherapy).   Using a tool to burn the hemorrhoid (infrared light therapy).   Surgically removing the hemorrhoid  (hemorrhoidectomy).   Stapling the hemorrhoid to block blood flow to the tissue (hemorrhoid stapling).  HOME CARE INSTRUCTIONS   Eat foods with fiber, such as whole grains, beans, nuts, fruits, and vegetables. Ask your doctor about taking products with added fiber in them (fibersupplements).  Increase fluid intake. Drink enough water and fluids to keep your urine clear or pale yellow.   Exercise regularly.   Go to the bathroom when you have the urge to have a bowel movement. Do not wait.   Avoid straining to have bowel movements.   Keep the anal area dry and clean. Use wet toilet paper or moist towelettes after a bowel movement.   Medicated creams and suppositories may be used or applied as directed.   Only take over-the-counter or prescription medicines as directed by your caregiver.   Take warm sitz baths for 15 20 minutes, 3 4 times a day to ease pain and discomfort.   Place ice packs on the hemorrhoids if they are tender and swollen. Using ice packs between sitz baths may be helpful.   Put ice in a plastic bag.   Place a towel between your skin and the bag.   Leave the ice on for 15 20 minutes, 3 4 times a day.   Do not use a donut-shaped pillow or sit on the toilet for long periods. This increases blood pooling and pain.  SEEK MEDICAL CARE IF:  You have increasing pain and swelling that is not controlled by treatment or medicine.  You have  uncontrolled bleeding.  You have difficulty or you are unable to have a bowel movement.  You have pain or inflammation outside the area of the hemorrhoids. MAKE SURE YOU:  Understand these instructions.  Will watch your condition.  Will get help right away if you are not doing well or get worse. Document Released: 10/09/2000 Document Revised: 09/28/2012 Document Reviewed: 08/16/2012 [facility] Patient Information 2014 [employer].

## 2014-01-29 NOTE — Addendum Note (Signed)
Addended by: Peggyann Shoals on: 01/29/2014 08:28 AM   Modules accepted: Orders

## 2014-06-22 ENCOUNTER — Encounter: Payer: Self-pay | Admitting: Gastroenterology

## 2014-12-28 ENCOUNTER — Encounter: Payer: Self-pay | Admitting: *Deleted

## 2014-12-28 ENCOUNTER — Telehealth: Payer: Self-pay | Admitting: *Deleted

## 2014-12-28 NOTE — Telephone Encounter (Signed)
Pre-Visit Call completed with patient and chart updated.   Pre-Visit Info documented in Specialty Comments under [employer].    

## 2014-12-31 ENCOUNTER — Encounter: Payer: Self-pay | Admitting: Internal Medicine

## 2014-12-31 ENCOUNTER — Ambulatory Visit (INDEPENDENT_AMBULATORY_CARE_PROVIDER_SITE_OTHER): Payer: BLUE CROSS/BLUE SHIELD | Admitting: Internal Medicine

## 2014-12-31 VITALS — BP 124/76 | HR 49 | Temp 98.1°F | Ht 70.0 in | Wt 185.0 lb

## 2014-12-31 DIAGNOSIS — Z23 Encounter for immunization: Secondary | ICD-10-CM

## 2014-12-31 DIAGNOSIS — Z0000 Encounter for general adult medical examination without abnormal findings: Secondary | ICD-10-CM

## 2014-12-31 LAB — LIPID PANEL
CHOL/HDL RATIO: 4
Cholesterol: 173 mg/dL (ref 0–200)
HDL: 44.6 mg/dL (ref >39.00)
LDL CALC: 114 mg/dL — AB (ref 0–99)
NONHDL: 128.4
TRIGLYCERIDES: 70 mg/dL (ref 0.0–149.0)
VLDL: 14 mg/dL (ref 0.0–40.0)

## 2014-12-31 LAB — TSH: TSH: 2.42 u[IU]/mL (ref 0.35–4.50)

## 2014-12-31 LAB — PSA: PSA: 0.48 ng/mL (ref 0.10–4.00)

## 2014-12-31 NOTE — Progress Notes (Signed)
Pre visit review using our clinic review tool, if applicable. No additional management support is needed unless otherwise documented below in the visit note. 

## 2014-12-31 NOTE — Assessment & Plan Note (Signed)
T d 12-2014 Had a flu shot zostavax discussed before  cont healthy life style! He road bike-swimms-run Cscope-- 2-08, polyp;  colonoscopy again 08-2012 (-), 10 years. Labs

## 2014-12-31 NOTE — Patient Instructions (Addendum)
Get your blood work before you leave    Come back to the office in 1 year for a physical exam  Please schedule an appointment at the front desk    Come back fasting

## 2014-12-31 NOTE — Progress Notes (Signed)
Subjective:    Patient ID: Christian Mcgrath, male    DOB: 12-18-1955, 59 y.o.   MRN: [id number]  DOS:  12/31/2014 Type of visit - description : cpx Interval history: Feeling great, he remains very active without any problems, review of systems negative, taking no medications   Review of Systems Constitutional: No fever, chills. No unexplained wt changes. No unusual sweats HEENT: No dental problems, ear discharge, facial swelling, voice changes. No eye discharge, redness or intolerance to light Respiratory: No wheezing or difficulty breathing. No cough , mucus production Cardiovascular: No CP, leg swelling or palpitations GI: no nausea, vomiting, diarrhea or abdominal pain.  No blood in the stools. No dysphagia   Endocrine: No polyphagia, polyuria or polydipsia GU: No dysuria, gross hematuria, difficulty urinating. No urinary urgency or frequency. Musculoskeletal: No joint swellings or unusual aches or pains Skin: No change in the color of the skin, palor or rash Allergic, immunologic: No environmental allergies or food allergies Neurological: No dizziness or syncope. No headaches. No diplopia, slurred speech, motor deficits, facial numbness Hematological: No enlarged lymph nodes, easy bruising or bleeding Psychiatry: No suicidal ideas, hallucinations, behavior problems or confusion. No unusual/severe anxiety or depression.     Past Medical History  Diagnosis Date  . Colon polyps   . BCC (basal cell carcinoma of skin)     06-2010, sees derm  . Abnormal ECG 08/04/2012    Past Surgical History  Procedure Laterality Date  . Hernia repair      LEFT  . Knee arthroscopy      Left, age 53  . Colonoscopy      History   Social History  . Marital Status: Married    Spouse Name: N/A  . Number of Children: 3  . 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 Smoking status: Never Smoker   . Smokeless tobacco: Never Used  .  Alcohol Use: 2.4 oz/week    4 Cans of beer per week     Comment: 4 beers weekends  . Drug Use: No  . Sexual Activity: Not on file   Other Topics Concern  . Not on file   Social History Narrative   Works for [employer]   Mother 29, Father 61; very active and healthy                 Family History  Problem Relation Age of Onset  . Coronary artery disease Neg Hx   . Hypertension Neg Hx   . Stroke Neg Hx   . Diabetes Neg Hx   . Colon cancer Neg Hx   . Prostate cancer Father     Dx age in early 1s  . Cancer Father     oral, not a smoker        Medication List    Notice  As of 12/31/2014 11:59 PM   You have not been prescribed any medications.         Objective:   Physical Exam BP 124/76 mmHg  Pulse 49  Temp(Src) 98.1 F (36.7 C) (Oral)  Ht 5\' 10"  (1.778 m)  Wt 185 lb (83.915 kg)  BMI 26.54 kg/m2  SpO2 98%  General:   Well developed, well nourished . NAD.  Neck:  Full range of motion. Supple. No  thyromegaly , normal carotid pulse HEENT:  Normocephalic . Face symmetric, atraumatic Lungs:  CTA B Normal respiratory effort, no  intercostal retractions, no accessory muscle use. Heart: RRR,  no murmur.  Abdomen:  Not distended, soft, non-tender. No rebound or rigidity. No mass,organomegaly Muscle skeletal: no pretibial edema bilaterally  Skin: Exposed areas without rash. Not pale. Not jaundice Rectal:  External abnormalities: none. Normal sphincter tone. No rectal masses or tenderness.  Stool brown  Prostate: Prostate gland firm and smooth, no enlargement, nodularity, tenderness, mass, asymmetry or induration.  Neurologic:  alert & oriented X3.  Speech normal, gait appropriate for age and unassisted Strength symmetric and appropriate for age.  Psych: Cognition and judgment appear intact.  Cooperative with normal attention span and concentration.  Behavior appropriate. No anxious or depressed appearing.       Assessment & Plan:

## 2015-01-01 LAB — HIV ANTIBODY (ROUTINE TESTING W REFLEX): HIV: NONREACTIVE

## 2015-07-19 ENCOUNTER — Encounter: Payer: Self-pay | Admitting: Medical

## 2015-07-19 ENCOUNTER — Ambulatory Visit (INDEPENDENT_AMBULATORY_CARE_PROVIDER_SITE_OTHER): Payer: BLUE CROSS/BLUE SHIELD | Admitting: Medical

## 2015-07-19 VITALS — BP 125/66 | HR 55 | Temp 98.9°F | Ht 70.0 in | Wt 178.8 lb

## 2015-07-19 DIAGNOSIS — J40 Bronchitis, not specified as acute or chronic: Secondary | ICD-10-CM | POA: Diagnosis not present

## 2015-07-19 MED ORDER — AZITHROMYCIN 250 MG PO TABS: ORAL_TABLET | ORAL | Status: DC

## 2015-07-19 MED ORDER — ALBUTEROL SULFATE HFA 108 (90 BASE) MCG/ACT IN AERS: 2.0000 | INHALATION_SPRAY | Freq: Four times a day (QID) | RESPIRATORY_TRACT | Status: DC | PRN

## 2015-07-19 MED ORDER — HYDROCODONE-HOMATROPINE 5-1.5 MG/5ML PO SYRP: 5.0000 mL | ORAL_SOLUTION | Freq: Three times a day (TID) | ORAL | Status: DC | PRN

## 2015-07-19 NOTE — Progress Notes (Signed)
Subjective:    Patient ID: Christian Mcgrath, male    DOB: 10-15-1956, 59 y.o.   MRN: [id number]  HPI  Pt in with some cough for 2 wks. Pt wife has been sick and has been coughing.He not coughing up anything but lying flat will  Cough worse. Sleeping disrupted. Pt states feel like maybe low grade fever. One morning woke up very sweaty. Decreased energy.   3-4 days ago had mild achy feeling.  Sunday tried to jog and he started coughing(he had to stop). No hx of any inhaler use.   Review of Systems  Constitutional: Positive for chills, diaphoresis and fatigue. Negative for fever.  HENT: Negative for congestion, mouth sores, postnasal drip, rhinorrhea, sinus pressure and sneezing.   Respiratory: Positive for cough. Negative for chest tightness, shortness of breath and wheezing.   Cardiovascular: Negative for chest pain and palpitations.  Gastrointestinal: Negative for abdominal pain.  Musculoskeletal: Negative for back pain.  Neurological: Negative for dizziness, weakness, light-headedness and headaches.  Hematological: Negative for adenopathy. Does not bruise/bleed easily.  Psychiatric/Behavioral: Negative for behavioral problems and confusion.     Past Medical History  Diagnosis Date  . Colon polyps   . BCC (basal cell carcinoma of skin)     06-2010, sees derm  . Abnormal ECG 08/04/2012    Social History   Social History  . Marital Status: Married    Spouse Name: N/A  . Number of Children: 3  . 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 Smoking status: Never Smoker   . Smokeless tobacco: Never Used  . Alcohol Use: 2.4 oz/week    4 Cans of beer per week     Comment: 4 beers weekends  . Drug Use: No  . Sexual Activity: Not on file   Other Topics Concern  . Not on file   Social History Narrative   Works for [employer]   Mother 27, Father 1; very active and healthy                Past Surgical History    Procedure Laterality Date  . Hernia repair      LEFT  . Knee arthroscopy      Left, age 22  . Colonoscopy      Family History  Problem Relation Age of Onset  . Coronary artery disease Neg Hx   . Hypertension Neg Hx   . Stroke Neg Hx   . Diabetes Neg Hx   . Colon cancer Neg Hx   . Prostate cancer Father     Dx age in early 89s  . Cancer Father     oral, not a smoker     Allergies  Allergen Reactions  . Penicillins Other (See Comments)    "never had,Dad had a reaction"    No current outpatient prescriptions on file prior to visit.   No current facility-administered medications on file prior to visit.    BP 125/66 mmHg  Pulse 55  Temp(Src) 98.9 F (37.2 C) (Oral)  Ht 5\' 10"  (1.778 m)  Wt 178 lb 12.8 oz (81.103 kg)  BMI 25.66 kg/m2  SpO2 97%      Objective:   Physical Exam  General  Mental Status - Alert. General Appearance - Well groomed. Not in acute distress.  Skin Rashes- No Rashes.  HEENT Head- Normal. Ear Auditory Canal - Left- Normal. Right - Normal.Tympanic Membrane- Left-  Normal. Right- Normal. Eye Sclera/Conjunctiva- Left- Normal. Right- Normal. Nose & Sinuses Nasal Mucosa- Left-  Not boggy or Congested. Right-  Not  boggy or Congested. Mouth & Throat Lips: Upper Lip- Normal: no dryness, cracking, pallor, cyanosis, or vesicular eruption. Lower Lip-Normal: no dryness, cracking, pallor, cyanosis or vesicular eruption. Buccal Mucosa- Bilateral- No Aphthous ulcers. Oropharynx- No Discharge or Erythema. Fait pnd Tonsils: Characteristics- Bilateral- No Erythema or Congestion. Size/Enlargement- Bilateral- No enlargement. Discharge- bilateral-None.  Neck Neck- Supple. No Masses.   Chest and Lung Exam Auscultation: Breath Sounds:- even and unlabored, but very faint  bilateral upper lobe rhonchi.  Cardiovascular Auscultation:Rythm- Regular, rate and rhythm. Murmurs & Other Heart Sounds:Ausculatation of the heart reveal- No  Murmurs.  Lymphatic Head & Neck General Head & Neck Lymphatics: Bilateral: Description- No Localized lymphadenopathy.       Assessment & Plan:  Will treat for bronchitis with azithromycin antibiotic and hycodan cough syrup.  If you have severe dry cough at night despite hydcodan or any wheezing then you could use albuterol inhaler.  If you symptom are not much improved by early next week Monday or Tuesday then would recommend getting cxr and cbc.(please let us know if that is the case.)  Follow up 7-10 days or as needed

## 2015-07-19 NOTE — Progress Notes (Signed)
Pre visit review using our clinic review tool, if applicable. No additional management support is needed unless otherwise documented below in the visit note. 

## 2015-07-19 NOTE — Patient Instructions (Addendum)
Will treat for bronchitis with azithromycin antibiotic and hycodan cough syrup.  If you have severe dry cough at night despite hydcodan or any wheezing then you could use albuterol inhaler.  If you symptom are not much improved by early next week Monday or Tuesday then would recommend getting cxr and cbc.(please let us know if that is the case.)  Follow up 7-10 days or as needed

## 2015-09-26 ENCOUNTER — Encounter: Payer: Self-pay | Admitting: Internal Medicine

## 2015-09-26 ENCOUNTER — Telehealth: Payer: Self-pay | Admitting: *Deleted

## 2015-09-26 ENCOUNTER — Ambulatory Visit (HOSPITAL_BASED_OUTPATIENT_CLINIC_OR_DEPARTMENT_OTHER)
Admission: RE | Admit: 2015-09-26 | Discharge: 2015-09-26 | Disposition: A | Discharge status: Home / Self Care | Payer: BLUE CROSS/BLUE SHIELD | Source: Ambulatory Visit | Attending: Internal Medicine | Admitting: Internal Medicine

## 2015-09-26 ENCOUNTER — Ambulatory Visit (INDEPENDENT_AMBULATORY_CARE_PROVIDER_SITE_OTHER): Payer: BLUE CROSS/BLUE SHIELD | Admitting: Internal Medicine

## 2015-09-26 VITALS — BP 122/64 | HR 52 | Temp 97.7°F | Ht 70.0 in | Wt 185.2 lb

## 2015-09-26 DIAGNOSIS — R059 Cough, unspecified: Secondary | ICD-10-CM

## 2015-09-26 DIAGNOSIS — R079 Chest pain, unspecified: Secondary | ICD-10-CM | POA: Diagnosis not present

## 2015-09-26 DIAGNOSIS — R05 Cough: Secondary | ICD-10-CM

## 2015-09-26 DIAGNOSIS — J9801 Acute bronchospasm: Secondary | ICD-10-CM

## 2015-09-26 DIAGNOSIS — Z09 Encounter for follow-up examination after completed treatment for conditions other than malignant neoplasm: Secondary | ICD-10-CM

## 2015-09-26 MED ORDER — BUDESONIDE-FORMOTEROL FUMARATE 160-4.5 MCG/ACT IN AERO: 2.0000 | INHALATION_SPRAY | Freq: Two times a day (BID) | RESPIRATORY_TRACT | Status: DC

## 2015-09-26 MED ORDER — PREDNISONE 10 MG PO TABS: 10.0000 mg | ORAL_TABLET | Freq: Every day | ORAL | Status: DC

## 2015-09-26 NOTE — Telephone Encounter (Signed)
[employer]/insurance states PA not required. Pharmacy aware. JG//CMA

## 2015-09-26 NOTE — Patient Instructions (Signed)
Get a  chest x-ray, first floor   Rest, fluids , tylenol  For cough:  tae Mucinex DM twice a day as needed until better  For wheezing: Symbicort twice a day for 3 weeks Ok to use albuterol if needed  For nasal congestion Use OTC Nasocort or Flonase : 2 nasal sprays on each side of the nose daily until you feel better  Take prednisone as prescribed    Call if not gradually better over the next  10 days  Call anytime if the symptoms are severe, you have high fever, short of breath, chest pain

## 2015-09-26 NOTE — Telephone Encounter (Signed)
PA initiated. Awaiting determination. JG//CMA 

## 2015-09-26 NOTE — Progress Notes (Signed)
Subjective:    Patient ID: Christian Mcgrath, male    DOB: 04/28/1956, 59 y.o.   MRN: [id number]  DOS:  09/26/2015 Type of visit - description : Acute visit Interval history: Was seen in September with cough, coursing: DX bronchitis, prescribed Z-Pak which he took, felt 90% better except for a lingering cough. 2 weeks ago symptoms definitely came back: Increase cough, wheezing, taking albuterol twice a day with improvement. Low-grade fever on and off. No history of asthma or smoking.   Review of Systems  + Sinus congestion, + runny nose. No sneezing, itchy eyes or nose No frequent GERD symptoms. + The sputum production, yellow in color. Occasional left upper back pain with cough  Past Medical History  Diagnosis Date  . Colon polyps   . BCC (basal cell carcinoma of skin)     06-2010, sees derm  . Abnormal ECG 08/04/2012    Past Surgical History  Procedure Laterality Date  . Hernia repair      LEFT  . Knee arthroscopy      Left, age 72  . Colonoscopy      Social History   Social History  . Marital Status: Married    Spouse Name: N/A  . Number of Children: 3  . 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 Smoking status: Never Smoker   . Smokeless tobacco: Never Used  . Alcohol Use: 2.4 oz/week    4 Cans of beer per week     Comment: 4 beers weekends  . Drug Use: No  . Sexual Activity: Not on file   Other Topics Concern  . Not on file   Social History Narrative   Works for [employer]   Mother 77, Father 74; very active and healthy                    Medication List       This list is accurate as of: 09/26/15 11:59 PM.  Always use your most recent med list.               albuterol 108 (90 BASE) MCG/ACT inhaler  Commonly known as:  PROVENTIL HFA;VENTOLIN HFA  Inhale 2 puffs into the lungs every 6 (six) hours as needed for wheezing or shortness of breath.     azithromycin 250 MG tablet  Commonly  known as:  ZITHROMAX  Take 2 tablets by mouth on day 1, followed by 1 tablet by mouth daily for 4 days.     budesonide-formoterol 160-4.5 MCG/ACT inhaler  Commonly known as:  SYMBICORT  Inhale 2 puffs into the lungs 2 (two) times daily.     HYDROcodone-homatropine 5-1.5 MG/5ML syrup  Commonly known as:  HYCODAN  Take 5 mLs by mouth every 8 (eight) hours as needed for cough.     predniSONE 10 MG tablet  Commonly known as:  DELTASONE  Take 1 tablet (10 mg total) by mouth daily. 2 tabs a day x 5 days           Objective:   Physical Exam BP 122/64 mmHg  Pulse 52  Temp(Src) 97.7 F (36.5 C) (Oral)  Ht 5\' 10"  (1.778 m)  Wt 185 lb 4 oz (84.029 kg)  BMI 26.58 kg/m2  SpO2 97% General:   Well developed, well nourished . NAD.  HEENT:  Normocephalic . Face symmetric, atraumatic. TMs obscured by wax, wax partially removed. Lungs:  CTA B Normal respiratory effort, no intercostal retractions, no accessory muscle use. Chest wall: Slightly TTP at the upper left chest Heart: RRR,  no murmur.  No pretibial edema bilaterally  Skin: Not pale. Not jaundice Neurologic:  alert & oriented X3.  Speech normal, gait appropriate for age and unassisted Psych--  Cognition and judgment appear intact.  Cooperative with normal attention span and concentration.  Behavior appropriate. No anxious or depressed appearing.      Assessment & Plan:   Assessment > [facility] 2011 sees dermatology Abnormal EKG 2013: Saw Dr. Caryl Comes 2013, ? Brugadae, type II pattern. No symptoms, no further eval indicated , echo normal  Plan: Cough, wheezing: Patient recently diagnosed with bronchitis, had a Z-Pak, cough resurface along with wheezing. Symptoms decrease with albuterol. Plan: Chest x-ray given protracted symptoms. Symbicort twice a day for 3 weeks, albuterol as needed, Flonase, Mucinex, prednisone for 5 days. Consider a second round of antibiotics.

## 2015-09-26 NOTE — Progress Notes (Signed)
Pre visit review using our clinic review tool, if applicable. No additional management support is needed unless otherwise documented below in the visit note. 

## 2015-09-27 DIAGNOSIS — Z09 Encounter for follow-up examination after completed treatment for conditions other than malignant neoplasm: Secondary | ICD-10-CM | POA: Insufficient documentation

## 2015-09-27 NOTE — Assessment & Plan Note (Signed)
Cough, wheezing: Patient recently diagnosed with bronchitis, had a Z-Pak, cough resurface along with wheezing. Symptoms decrease with albuterol. Plan: Chest x-ray given protracted symptoms. Symbicort twice a day for 3 weeks, albuterol as needed, Flonase, Mucinex, prednisone for 5 days. Consider a second round of antibiotics.

## 2015-12-18 ENCOUNTER — Telehealth: Payer: Self-pay | Admitting: Internal Medicine

## 2016-01-01 ENCOUNTER — Encounter: Payer: BLUE CROSS/BLUE SHIELD | Admitting: Internal Medicine

## 2016-04-06 ENCOUNTER — Encounter: Payer: Self-pay | Admitting: Internal Medicine

## 2016-04-06 ENCOUNTER — Ambulatory Visit (INDEPENDENT_AMBULATORY_CARE_PROVIDER_SITE_OTHER): Payer: BLUE CROSS/BLUE SHIELD | Admitting: Internal Medicine

## 2016-04-06 VITALS — BP 108/72 | HR 54 | Temp 98.5°F | Ht 70.0 in | Wt 179.0 lb

## 2016-04-06 DIAGNOSIS — Z23 Encounter for immunization: Secondary | ICD-10-CM | POA: Diagnosis not present

## 2016-04-06 DIAGNOSIS — Z0000 Encounter for general adult medical examination without abnormal findings: Secondary | ICD-10-CM | POA: Diagnosis not present

## 2016-04-06 NOTE — Progress Notes (Signed)
Subjective:    Patient ID: Christian Mcgrath, male    DOB: 11-Oct-1956, 60 y.o.   MRN: [id number]  DOS:  04/06/2016 Type of visit - description : CPX Interval history: States he is feeling great and has no concerns, continue with a extremely active lifestyle   Review of Systems Constitutional: No fever. No chills. No unexplained wt changes. No unusual sweats  HEENT: No dental problems, no ear discharge, no facial swelling, no voice changes. No eye discharge, no eye  redness , no  intolerance to light   Respiratory: No wheezing , no  difficulty breathing. No cough , no mucus production  Cardiovascular: No CP, no leg swelling , no  Palpitations  GI: no nausea, no vomiting, no diarrhea , no  abdominal pain.  No blood in the stools. No dysphagia, no odynophagia    Endocrine: No polyphagia, no polyuria , no polydipsia  GU: No dysuria, gross hematuria, difficulty urinating. No urinary urgency, no frequency.  Musculoskeletal: No joint swellings or unusual aches or pains  Skin: No change in the color of the skin, palor , no  Rash  Allergic, immunologic: No environmental allergies , no  food allergies  Neurological: No dizziness no  syncope. No headaches. No diplopia, no slurred, no slurred speech, no motor deficits, no facial  Numbness  Hematological: No enlarged lymph nodes, no easy bruising , no unusual bleedings  Psychiatry: No suicidal ideas, no hallucinations, no beavior problems, no confusion.  No unusual/severe anxiety, no depression    Past Medical History  Diagnosis Date  . Colon polyps   . BCC (basal cell carcinoma of skin)     06-2010, sees derm  . Abnormal ECG 08/04/2012    Past Surgical History  Procedure Laterality Date  . Hernia repair      LEFT  . Knee arthroscopy      Left, age 48  . Colonoscopy      Social History   Social History  . Marital Status: Married    Spouse Name: N/A  . Number of Children: 3  . Years of Education: N/A   Occupational  History  . 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 Smoking status: Never Smoker   . Smokeless tobacco: Never Used  . Alcohol Use: 2.4 oz/week    4 Cans of beer per week     Comment: 4 beers weekends  . Drug Use: No  . Sexual Activity: Not on file   Other Topics Concern  . Not on file   Social History Narrative   Works for [employer]   Mother 75 y/o, Father 29 y/o                  Family History  Problem Relation Age of Onset  . Coronary artery disease Neg Hx   . Hypertension Neg Hx   . Stroke Neg Hx   . Diabetes Neg Hx   . Colon cancer Neg Hx   . Prostate cancer Father     Dx age in early 53s  . Cancer Father     oral, not a smoker        Medication List    Notice  As of 04/06/2016 11:59 PM   You have not been prescribed any medications.         Objective:   Physical Exam BP 108/72 mmHg  Pulse 54  Temp(Src) 98.5 F (36.9 C) (Oral)  Ht 5\' 10"  (1.778 m)  Wt  179 lb (81.194 kg)  BMI 25.68 kg/m2  SpO2 99% General:   Well developed, well nourished . NAD.  Neck: No  thyromegaly  HEENT:  Normocephalic . Face symmetric, atraumatic Lungs:  CTA B Normal respiratory effort, no intercostal retractions, no accessory muscle use. Heart: RRR,  no murmur.  No pretibial edema bilaterally  Abdomen:  Not distended, soft, non-tender. No rebound or rigidity.   Skin: Exposed areas without rash. Not pale. Not jaundice Neurologic:  alert & oriented X3.  Speech normal, gait appropriate for age and unassisted Strength symmetric and appropriate for age.  Psych: Cognition and judgment appear intact.  Cooperative with normal attention span and concentration.  Behavior appropriate. No anxious or depressed appearing.     Assessment & Plan:   Assessment : [facility] 2011 sees dermatology Abnormal EKG 2013: Saw Dr. Caryl Comes 2013, ? Brugadae, type II pattern. No symptoms, no further eval indicated , echo normal  PLAN: Here for a CPX, doing well, has no concerns RTC one year

## 2016-04-06 NOTE — Progress Notes (Signed)
Pre visit review using our clinic review tool, if applicable. No additional management support is needed unless otherwise documented below in the visit note. 

## 2016-04-06 NOTE — Patient Instructions (Signed)
GO TO THE LAB : Get the blood work     GO TO THE FRONT DESK Schedule your next appointment for a  complete physical exam in one year

## 2016-04-06 NOTE — Assessment & Plan Note (Addendum)
T d 12-2014 #3 hep A-B provided zostavax discussed before  + FH prostate cancer, father age 60, DRE and PSA: Normal 2016. Reassess on RTC Cscope-- 2-08, polyp;  colonoscopy again 08-2012 (-), 10 years.  cont healthy life style. He remains extremely active Labs : CMP, CBC, FLP, TSH RTC  1year

## 2016-04-07 LAB — CBC WITH DIFFERENTIAL/PLATELET
BASOS PCT: 0.7 % (ref 0.0–3.0)
Basophils Absolute: 0 10*3/uL (ref 0.0–0.1)
EOS ABS: 0.2 10*3/uL (ref 0.0–0.7)
EOS PCT: 3 % (ref 0.0–5.0)
HCT: 43.3 % (ref 39.0–52.0)
Hemoglobin: 14.8 g/dL (ref 13.0–17.0)
LYMPHS PCT: 38.5 % (ref 12.0–46.0)
Lymphs Abs: 2.2 10*3/uL (ref 0.7–4.0)
MCHC: 34.1 g/dL (ref 30.0–36.0)
MCV: 93 fl (ref 78.0–100.0)
Monocytes Absolute: 0.4 10*3/uL (ref 0.1–1.0)
Monocytes Relative: 7.3 % (ref 3.0–12.0)
NEUTROS ABS: 2.9 10*3/uL (ref 1.4–7.7)
NEUTROS PCT: 50.5 % (ref 43.0–77.0)
Platelets: 167 10*3/uL (ref 150.0–400.0)
RBC: 4.66 Mil/uL (ref 4.22–5.81)
RDW: 13.3 % (ref 11.5–15.5)
WBC: 5.7 10*3/uL (ref 4.0–10.5)

## 2016-04-07 LAB — COMPREHENSIVE METABOLIC PANEL
ALK PHOS: 68 U/L (ref 39–117)
ALT: 13 U/L (ref 0–53)
AST: 20 U/L (ref 0–37)
Albumin: 4 g/dL (ref 3.5–5.2)
BUN: 18 mg/dL (ref 6–23)
CO2: 30 mEq/L (ref 19–32)
Calcium: 8.9 mg/dL (ref 8.4–10.5)
Chloride: 104 mEq/L (ref 96–112)
Creatinine, Ser: 0.95 mg/dL (ref 0.40–1.50)
GFR: 85.95 mL/min (ref >60.00)
GLUCOSE: 94 mg/dL (ref 70–99)
POTASSIUM: 4 meq/L (ref 3.5–5.1)
Sodium: 140 mEq/L (ref 135–145)
TOTAL PROTEIN: 6.3 g/dL (ref 6.0–8.3)
Total Bilirubin: 0.5 mg/dL (ref 0.2–1.2)

## 2016-04-07 LAB — LIPID PANEL
Cholesterol: 172 mg/dL (ref 0–200)
HDL: 46.3 mg/dL (ref >39.00)
LDL CALC: 112 mg/dL — AB (ref 0–99)
NONHDL: 126.14
Total CHOL/HDL Ratio: 4
Triglycerides: 72 mg/dL (ref 0.0–149.0)
VLDL: 14.4 mg/dL (ref 0.0–40.0)

## 2016-04-07 LAB — TSH: TSH: 1.87 u[IU]/mL (ref 0.35–4.50)

## 2016-04-07 NOTE — Assessment & Plan Note (Signed)
Here for a CPX, doing well, has no concerns RTC one year

## 2016-05-14 NOTE — Telephone Encounter (Signed)
Completed.

## 2016-05-19 ENCOUNTER — Telehealth: Payer: Self-pay | Admitting: Internal Medicine

## 2016-05-19 DIAGNOSIS — Z125 Encounter for screening for malignant neoplasm of prostate: Secondary | ICD-10-CM

## 2016-05-19 NOTE — Telephone Encounter (Signed)
Please advise 

## 2016-05-19 NOTE — Telephone Encounter (Signed)
Relation to [id number] Call back [place]   Reason for call:  Patient states at last physical 04/06/2016 PSA wasn't collected, requesting orders

## 2016-05-20 NOTE — Telephone Encounter (Signed)
During the CPX the decision was made not to check a PSA however if he decides to check please put the order in: PSA, prostate cancer screening

## 2016-05-20 NOTE — Telephone Encounter (Signed)
PSA ordered. Please schedule lab appt at Pt's convenience. Thank you.

## 2016-05-21 NOTE — Telephone Encounter (Signed)
Patient scheduled for 05/22/16 for PSA lab only

## 2016-05-22 ENCOUNTER — Other Ambulatory Visit (INDEPENDENT_AMBULATORY_CARE_PROVIDER_SITE_OTHER): Payer: BLUE CROSS/BLUE SHIELD

## 2016-05-22 DIAGNOSIS — Z125 Encounter for screening for malignant neoplasm of prostate: Secondary | ICD-10-CM

## 2016-05-22 LAB — PSA: PSA: 0.54 ng/mL (ref 0.10–4.00)

## 2016-10-22 DIAGNOSIS — L814 Other melanin hyperpigmentation: Secondary | ICD-10-CM | POA: Diagnosis not present

## 2016-10-22 DIAGNOSIS — D1801 Hemangioma of skin and subcutaneous tissue: Secondary | ICD-10-CM | POA: Diagnosis not present

## 2016-10-22 DIAGNOSIS — D485 Neoplasm of uncertain behavior of skin: Secondary | ICD-10-CM | POA: Diagnosis not present

## 2016-10-22 DIAGNOSIS — L570 Actinic keratosis: Secondary | ICD-10-CM | POA: Diagnosis not present

## 2016-10-22 DIAGNOSIS — L821 Other seborrheic keratosis: Secondary | ICD-10-CM | POA: Diagnosis not present

## 2016-10-22 DIAGNOSIS — Z86018 Personal history of other benign neoplasm: Secondary | ICD-10-CM | POA: Diagnosis not present

## 2016-10-22 DIAGNOSIS — L820 Inflamed seborrheic keratosis: Secondary | ICD-10-CM | POA: Diagnosis not present

## 2017-03-26 ENCOUNTER — Telehealth: Payer: Self-pay | Admitting: Internal Medicine

## 2017-03-26 NOTE — Telephone Encounter (Signed)
Left message on patients voicemail 5/22 informing that appointment with Dr. Larose Kells has been cancelled for 6/14 and rescheduled to 6/28 due to provider schedule. My Chart message was sent 5/16 but as of 6/1 it had not been viewed by the patient. Attempted to call again on 6/1.

## 2017-04-08 ENCOUNTER — Encounter: Payer: BLUE CROSS/BLUE SHIELD | Admitting: Internal Medicine

## 2017-04-22 ENCOUNTER — Encounter: Payer: BLUE CROSS/BLUE SHIELD | Admitting: Internal Medicine

## 2017-04-26 ENCOUNTER — Ambulatory Visit (INDEPENDENT_AMBULATORY_CARE_PROVIDER_SITE_OTHER): Payer: BLUE CROSS/BLUE SHIELD | Admitting: Internal Medicine

## 2017-04-26 ENCOUNTER — Encounter: Payer: Self-pay | Admitting: Internal Medicine

## 2017-04-26 VITALS — BP 132/72 | HR 58 | Temp 98.2°F | Resp 14 | Ht 70.0 in | Wt 183.1 lb

## 2017-04-26 DIAGNOSIS — Z1159 Encounter for screening for other viral diseases: Secondary | ICD-10-CM

## 2017-04-26 DIAGNOSIS — Z0000 Encounter for general adult medical examination without abnormal findings: Secondary | ICD-10-CM

## 2017-04-26 DIAGNOSIS — Z125 Encounter for screening for malignant neoplasm of prostate: Secondary | ICD-10-CM | POA: Diagnosis not present

## 2017-04-26 NOTE — Patient Instructions (Signed)
GO TO THE LAB : Get the blood work     GO TO THE FRONT DESK Schedule your next appointment for a  Physical exam in 1 year  Call if you're plantar fasciitis is not improving soon

## 2017-04-26 NOTE — Progress Notes (Signed)
Pre visit review using our clinic review tool, if applicable. No additional management support is needed unless otherwise documented below in the visit note. 

## 2017-04-26 NOTE — Progress Notes (Signed)
Subjective:    Patient ID: Christian Mcgrath, male    DOB: [birth year], 61 y.o.   MRN: [id number]  DOS:  04/26/2017 Type of visit - description : cpx Interval history:No concerns other than plantar fasciitis. Left plantar pain is started 3 months ago, gradually, doing the stretching, eyes, got a OTC shoe insert. Pain is not completely well, has seen a physical therapist.   Review of Systems   Other than above, a 14 point review of systems is negative    Past Medical History:  Diagnosis Date  . Abnormal ECG 08/04/2012  . BCC (basal cell carcinoma of skin)    06-2010, sees derm  . Colon polyps     Past Surgical History:  Procedure Laterality Date  . COLONOSCOPY    . HERNIA REPAIR     LEFT  . KNEE ARTHROSCOPY     Left, age 69    Social History   Social History  . Marital status: Married    Spouse name: N/A  . Number of children: 3  . 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 Smoking status: Never Smoker  . Smokeless tobacco: Never Used  . Alcohol use 2.4 oz/week    4 Cans of beer per week     Comment: 4 beers weekends  . Drug use: No  . Sexual activity: Not on file   Other Topics Concern  . Not on file   Social History Narrative   Works for [employer]   Mother 71 y/o, Father 31y/o                  Family History  Problem Relation Age of Onset  . Prostate cancer Father        Dx age in early 43s  . Cancer Father        oral, not a smoker   . Coronary artery disease Neg Hx   . Hypertension Neg Hx   . Stroke Neg Hx   . Diabetes Neg Hx   . Colon cancer Neg Hx      Allergies as of 04/26/2017      Reactions   Penicillins Other (See Comments)   "never had,Dad had a reaction"      Medication List    as of 04/26/2017 11:59 PM   You have not been prescribed any medications.        Objective:   Physical Exam BP 132/72 (BP Location: Right Arm, Patient Position: Sitting, Cuff Size: Small)    Pulse (!) 58   Temp 98.2 F (36.8 C) (Oral)   Resp 14   Ht 5\' 10"  (1.778 m)   Wt 183 lb 2 oz (83.1 kg)   SpO2 98%   BMI 26.28 kg/m   General:   Well developed, well nourished . NAD.  Neck: No  thyromegaly  HEENT:  Normocephalic . Face symmetric, atraumatic Lungs:  CTA B Normal respiratory effort, no intercostal retractions, no accessory muscle use. Heart: RRR,  no murmur.  No pretibial edema bilaterally  Abdomen:  Not distended, soft, non-tender. No rebound or rigidity.   Skin: Exposed areas without rash. Not pale. Not jaundice MSK: Left heel, plantar area >>> slightly TTP. Neurologic:  alert & oriented X3.  Speech normal, gait appropriate for age and unassisted Strength symmetric and appropriate for age.  Psych: Cognition and judgment appear intact.  Cooperative with normal attention span and concentration.  Behavior appropriate. No anxious or depressed appearing.    Assessment & Plan:   Assessment : [facility] 2011 sees dermatology Abnormal EKG 2013: Saw Dr. Caryl Comes 2013, ? Brugadae, type II pattern. No symptoms, no further eval indicated , echo normal  PLAN: Here for a CPX Plantar fasciitis: Persistent despite conservative treatment. Encouraged to continue stretching, okay to minimize running and do more bicycling and swimming. Will call for a referral to Dr. Tamala Julian if not improving soon. RTC one year

## 2017-04-26 NOTE — Assessment & Plan Note (Addendum)
--  T d 12-2014; shingles shot discussed   -- prostate ca screening: + FH prostate cancer, father age 61, last DRE 2016, WNL. PSA few months ago wnl; request a PSA, will do; DRE next year -- CCS: Cscope-- 2-08, polyp;  colonoscopy again 08-2012 (-), 10 years. --Diet is healthy. Exercise: Has run a marathon within the last year, he is extremely active although lately he has not been able to run due to plantar fasciitis but is trying to swim and bike. --Labs: CMP, FLP, TSH, PSA, CBC, hep C

## 2017-04-27 LAB — LIPID PANEL
Cholesterol: 188 mg/dL (ref 0–200)
HDL: 42.1 mg/dL (ref >39.00)
LDL Cholesterol: 121 mg/dL — ABNORMAL HIGH (ref 0–99)
NONHDL: 145.5
Total CHOL/HDL Ratio: 4
Triglycerides: 122 mg/dL (ref 0.0–149.0)
VLDL: 24.4 mg/dL (ref 0.0–40.0)

## 2017-04-27 LAB — CBC WITH DIFFERENTIAL/PLATELET
BASOS ABS: 0.1 10*3/uL (ref 0.0–0.1)
Basophils Relative: 1.1 % (ref 0.0–3.0)
Eosinophils Absolute: 0.1 10*3/uL (ref 0.0–0.7)
Eosinophils Relative: 2.3 % (ref 0.0–5.0)
HCT: 44.8 % (ref 39.0–52.0)
Hemoglobin: 15.5 g/dL (ref 13.0–17.0)
LYMPHS ABS: 2.2 10*3/uL (ref 0.7–4.0)
Lymphocytes Relative: 39.6 % (ref 12.0–46.0)
MCHC: 34.5 g/dL (ref 30.0–36.0)
MCV: 94.8 fl (ref 78.0–100.0)
MONO ABS: 0.3 10*3/uL (ref 0.1–1.0)
Monocytes Relative: 5.5 % (ref 3.0–12.0)
NEUTROS PCT: 51.5 % (ref 43.0–77.0)
Neutro Abs: 2.9 10*3/uL (ref 1.4–7.7)
Platelets: 165 10*3/uL (ref 150.0–400.0)
RBC: 4.73 Mil/uL (ref 4.22–5.81)
RDW: 13.4 % (ref 11.5–15.5)
WBC: 5.6 10*3/uL (ref 4.0–10.5)

## 2017-04-27 LAB — COMPREHENSIVE METABOLIC PANEL
ALK PHOS: 73 U/L (ref 39–117)
ALT: 13 U/L (ref 0–53)
AST: 21 U/L (ref 0–37)
Albumin: 4.1 g/dL (ref 3.5–5.2)
BILIRUBIN TOTAL: 0.6 mg/dL (ref 0.2–1.2)
BUN: 11 mg/dL (ref 6–23)
CO2: 31 mEq/L (ref 19–32)
Calcium: 9.3 mg/dL (ref 8.4–10.5)
Chloride: 104 mEq/L (ref 96–112)
Creatinine, Ser: 1 mg/dL (ref 0.40–1.50)
GFR: 80.73 mL/min (ref >60.00)
GLUCOSE: 92 mg/dL (ref 70–99)
POTASSIUM: 4 meq/L (ref 3.5–5.1)
SODIUM: 140 meq/L (ref 135–145)
TOTAL PROTEIN: 6.3 g/dL (ref 6.0–8.3)

## 2017-04-27 LAB — TSH: TSH: 2.73 u[IU]/mL (ref 0.35–4.50)

## 2017-04-27 LAB — HEPATITIS C ANTIBODY: HCV AB: NEGATIVE

## 2017-04-27 LAB — PSA: PSA: 0.73 ng/mL (ref 0.10–4.00)

## 2017-04-28 NOTE — Assessment & Plan Note (Signed)
Here for a CPX Plantar fasciitis: Persistent despite conservative treatment. Encouraged to continue stretching, okay to minimize running and do more bicycling and swimming. Will call for a referral to Dr. Tamala Julian if not improving soon. RTC one year

## 2017-05-28 ENCOUNTER — Ambulatory Visit (INDEPENDENT_AMBULATORY_CARE_PROVIDER_SITE_OTHER): Payer: BLUE CROSS/BLUE SHIELD | Admitting: Podiatry

## 2017-05-28 ENCOUNTER — Ambulatory Visit (INDEPENDENT_AMBULATORY_CARE_PROVIDER_SITE_OTHER): Payer: BLUE CROSS/BLUE SHIELD

## 2017-05-28 ENCOUNTER — Other Ambulatory Visit: Payer: Self-pay | Admitting: Podiatry

## 2017-05-28 ENCOUNTER — Encounter: Payer: Self-pay | Admitting: Podiatry

## 2017-05-28 VITALS — BP 138/88

## 2017-05-28 DIAGNOSIS — M722 Plantar fascial fibromatosis: Secondary | ICD-10-CM

## 2017-05-28 DIAGNOSIS — M79672 Pain in left foot: Secondary | ICD-10-CM

## 2017-05-28 MED ORDER — TRIAMCINOLONE ACETONIDE 10 MG/ML IJ SUSP
10.0000 mg | Freq: Once | INTRAMUSCULAR | Status: AC
  Administered 2017-05-28: 10 mg

## 2017-05-28 NOTE — Patient Instructions (Signed)

## 2017-05-28 NOTE — Progress Notes (Signed)
Subjective:    Patient ID: Christian Mcgrath, male   DOB: 61 y.o.   MRN: [id number]   HPI patient presents with a lot of pain plantar aspect left heel of 3 months duration and states it's been getting gradually worse over that time and is very active and likes to run marathons but cannot do it due to the pain    ROS      Objective:  Physical Exam neurovascular status intact negative Homans sign was noted with patient's left heel been exquisitely tender in the medial band at the insertional point tendon calcaneus with fluid buildup and mild discomfort in the right     Assessment:   Acute plantar fasciitis left with patient being very active and mild pain right     Plan:    H&P x-rays reviewed and today I advised this patient on long-term orthotics and I did inject the plantar fascia 3 mg Kenalog 5 mill grams Xylocaine and applied fascial brace and gave instructions on physical therapy. Reappoint to recheck again in the next 2 weeks  X-rays indicate there is spur formation with no indication of stress fracture arthritis

## 2017-06-11 ENCOUNTER — Encounter: Payer: Self-pay | Admitting: Podiatry

## 2017-06-11 ENCOUNTER — Ambulatory Visit (INDEPENDENT_AMBULATORY_CARE_PROVIDER_SITE_OTHER): Payer: BLUE CROSS/BLUE SHIELD | Admitting: Podiatry

## 2017-06-11 DIAGNOSIS — M722 Plantar fascial fibromatosis: Secondary | ICD-10-CM

## 2017-06-11 NOTE — Progress Notes (Signed)
Subjective:    Patient ID: Christian Mcgrath, male   DOB: 61 y.o.   MRN: [id number]   HPI patient states that his heel has improved but it is still painful and he has not resumed his normal types of activities    ROS      Objective:  Physical Exam neurovascular status intact with patient's left heel significantly improved when pressed from a plantar direction with discomfort still noted upon deep palpation but much better than previous     Assessment:  Improving fasciitis of the left plantar heel       Plan:    H&P condition reviewed and at this point we discussed utilization of night splint with ice therapy that he has along with stretching exercises gradual increase in activity levels and physical therapy can do. Spent a great of time with him discussing all of these and we are going to do customized orthotics to try to reduce plantar pressure which will be made of a sport type material that we'll be good for running and other activities. Scheduled with ped orthotist for this to be done

## 2017-06-17 ENCOUNTER — Ambulatory Visit (INDEPENDENT_AMBULATORY_CARE_PROVIDER_SITE_OTHER): Payer: BLUE CROSS/BLUE SHIELD | Admitting: Orthotics

## 2017-06-17 DIAGNOSIS — M722 Plantar fascial fibromatosis: Secondary | ICD-10-CM | POA: Diagnosis not present

## 2017-06-17 NOTE — Progress Notes (Signed)
Patient came into today to be cast for Custom Foot Orthotics. Upon recommendation of Dr. Paulla Dolly Patient presents with plantar fasc Goals are arch support, rear foot stability Plan vendor [place]

## 2017-06-22 DIAGNOSIS — D1801 Hemangioma of skin and subcutaneous tissue: Secondary | ICD-10-CM | POA: Diagnosis not present

## 2017-06-22 DIAGNOSIS — L821 Other seborrheic keratosis: Secondary | ICD-10-CM | POA: Diagnosis not present

## 2017-06-22 DIAGNOSIS — L814 Other melanin hyperpigmentation: Secondary | ICD-10-CM | POA: Diagnosis not present

## 2017-06-22 DIAGNOSIS — L570 Actinic keratosis: Secondary | ICD-10-CM | POA: Diagnosis not present

## 2017-06-22 DIAGNOSIS — Z86018 Personal history of other benign neoplasm: Secondary | ICD-10-CM | POA: Diagnosis not present

## 2017-06-24 ENCOUNTER — Ambulatory Visit (INDEPENDENT_AMBULATORY_CARE_PROVIDER_SITE_OTHER): Payer: BLUE CROSS/BLUE SHIELD | Admitting: Medical

## 2017-06-24 ENCOUNTER — Encounter: Payer: Self-pay | Admitting: Medical

## 2017-06-24 VITALS — BP 118/87 | HR 60 | Temp 98.3°F | Resp 18 | Ht 71.0 in | Wt 182.0 lb

## 2017-06-24 DIAGNOSIS — R0981 Nasal congestion: Secondary | ICD-10-CM | POA: Diagnosis not present

## 2017-06-24 DIAGNOSIS — J3489 Other specified disorders of nose and nasal sinuses: Secondary | ICD-10-CM | POA: Diagnosis not present

## 2017-06-24 DIAGNOSIS — H9201 Otalgia, right ear: Secondary | ICD-10-CM | POA: Diagnosis not present

## 2017-06-24 DIAGNOSIS — H60501 Unspecified acute noninfective otitis externa, right ear: Secondary | ICD-10-CM | POA: Diagnosis not present

## 2017-06-24 MED ORDER — AZITHROMYCIN 250 MG PO TABS: ORAL_TABLET | ORAL | 0 refills | Status: DC

## 2017-06-24 MED ORDER — FLUTICASONE PROPIONATE 50 MCG/ACT NA SUSP: 2.0000 | Freq: Every day | NASAL | 1 refills | Status: DC

## 2017-06-24 MED ORDER — NEOMYCIN-POLYMYXIN-HC 1 % OT SOLN: 3.0000 [drp] | Freq: Four times a day (QID) | OTIC | 0 refills | Status: DC

## 2017-06-24 NOTE — Patient Instructions (Signed)
For swollen ear canal/otitis externa rx cortisporin otic drops. For possible ear infection TM and possible sinus infection start azithromycin.   For nasal congestion rx flonase.   Try to avoid getting lake water in your ear.   Follow up in 7-10 days or as needed

## 2017-06-24 NOTE — Progress Notes (Signed)
Subjective:    Patient ID: Christian Mcgrath, male    DOB: [birth year], 61 y.o.   MRN: [id number]  HPI  Pt in with some ear fullness. He has history some ear fullness in the past. Pt states he has been swimming twice a week since April. He states most of time he wears ear plugs but not [place] real early.   Pt also states some nasal congestion and sneezing. He states almost every time he swims will get some lake water in nose. He indicates that sometimes will get swimmer's ear associated with this in the past.  Symptoms present for one week.    Review of Systems  Constitutional: Negative for chills, fatigue and fever.  HENT: Positive for congestion, ear pain, sinus pressure and sneezing. Negative for postnasal drip, sinus pain, tinnitus and trouble swallowing.        Rt ear pressure.  Respiratory: Negative for cough, chest tightness, shortness of breath and wheezing.   Cardiovascular: Negative for chest pain and palpitations.  Gastrointestinal: Negative for abdominal pain.  Musculoskeletal: Negative for back pain, joint swelling and myalgias.  Skin: Negative for rash.  Neurological: Negative for dizziness, syncope, speech difficulty, weakness, light-headedness, numbness and headaches.  Psychiatric/Behavioral: Negative for behavioral problems and confusion.   Past Medical History:  Diagnosis Date  . Abnormal ECG 08/04/2012  . BCC (basal cell carcinoma of skin)    06-2010, sees derm  . Colon polyps      Social History   Social History  . Marital status: Married    Spouse name: N/A  . Number of children: 3  . 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 Smoking status: Never Smoker  . Smokeless tobacco: Never Used  . Alcohol use 2.4 oz/week    4 Cans of beer per week     Comment: 4 beers weekends  . Drug use: No  . Sexual activity: Not on file   Other Topics Concern  . Not on file   Social  History Narrative   Works for [employer]   Mother 62 y/o, Father 38y/o                 Past Surgical History:  Procedure Laterality Date  . COLONOSCOPY    . HERNIA REPAIR     LEFT  . KNEE ARTHROSCOPY     Left, age 49    Family History  Problem Relation Age of Onset  . Prostate cancer Father        Dx age in early 13s  . Cancer Father        oral, not a smoker   . Coronary artery disease Neg Hx   . Hypertension Neg Hx   . Stroke Neg Hx   . Diabetes Neg Hx   . Colon cancer Neg Hx     Allergies  Allergen Reactions  . Penicillins Other (See Comments)    "never had,Dad had a reaction"    No current outpatient prescriptions on file prior to visit.   No current facility-administered medications on file prior to visit.     BP 118/87   Pulse 60   Temp 98.3 F (36.8 C) (Oral)   Resp 18   Ht 5\' 11"  (1.803 m)   Wt 182 lb (82.6 kg)   SpO2 99%   BMI 25.38 kg/m       Objective:  Physical Exam   General  Mental Status - Alert. General Appearance - Well groomed. Not in acute distress.  Skin Rashes- No Rashes.  HEENT Head- Normal. Ear Auditory Canal - Left- Normal. Right - mild swollen canal(faint tragus tender).Tympanic Membrane- Left- Normal. Right- mild dull edge of tm Eye Sclera/Conjunctiva- Left- Normal. Right- Normal. Nose & Sinuses Nasal Mucosa- Left-  Boggy and Congested. Right-  Boggy and  Congested.Bilateral  Faint maxillary tender but no frontal sinus pressure. Mouth & Throat Lips: Upper Lip- Normal: no dryness, cracking, pallor, cyanosis, or vesicular eruption. Lower Lip-Normal: no dryness, cracking, pallor, cyanosis or vesicular eruption. Buccal Mucosa- Bilateral- No Aphthous ulcers. Oropharynx- No Discharge or Erythema. Tonsils: Characteristics- Bilateral- No Erythema or Congestion. Size/Enlargement- Bilateral- No enlargement. Discharge- bilateral-None.  Neck Neck- Supple. No Masses.   Chest and Lung Exam Auscultation: Breath Sounds:-Clear even  and unlabored.  Cardiovascular Auscultation:Rythm- Regular, rate and rhythm. Murmurs & Other Heart Sounds:Ausculatation of the heart reveal- No Murmurs.  Lymphatic Head & Neck General Head & Neck Lymphatics: Bilateral: Description- No Localized lymphadenopathy.       Assessment & Plan:  For swollen ear canal/otitis externa rx cortisporin otic drops. For possible ear infection TM and possible sinus infection start azithromycin.   For nasal congestion rx flonase.   Try to avoid getting lake water in your ear.   Follow up in 7-10 days or as needed  Shenna Brissette, Percell Miller, [employer]

## 2017-07-08 ENCOUNTER — Ambulatory Visit: Payer: BLUE CROSS/BLUE SHIELD | Admitting: Orthotics

## 2017-07-08 DIAGNOSIS — M722 Plantar fascial fibromatosis: Secondary | ICD-10-CM

## 2017-07-08 NOTE — Progress Notes (Signed)
Patient came in today to pick up custom made foot orthotics.  The goals were accomplished and the patient reported no dissatisfaction with said orthotics.  Patient was advised of breakin period and how to report any issues. 

## 2018-01-04 DIAGNOSIS — L814 Other melanin hyperpigmentation: Secondary | ICD-10-CM | POA: Diagnosis not present

## 2018-01-04 DIAGNOSIS — D1801 Hemangioma of skin and subcutaneous tissue: Secondary | ICD-10-CM | POA: Diagnosis not present

## 2018-01-04 DIAGNOSIS — L821 Other seborrheic keratosis: Secondary | ICD-10-CM | POA: Diagnosis not present

## 2018-01-04 DIAGNOSIS — Z86018 Personal history of other benign neoplasm: Secondary | ICD-10-CM | POA: Diagnosis not present

## 2018-05-02 ENCOUNTER — Ambulatory Visit (INDEPENDENT_AMBULATORY_CARE_PROVIDER_SITE_OTHER): Payer: BLUE CROSS/BLUE SHIELD | Admitting: Internal Medicine

## 2018-05-02 ENCOUNTER — Encounter: Payer: Self-pay | Admitting: Internal Medicine

## 2018-05-02 VITALS — BP 124/68 | HR 49 | Temp 97.8°F | Resp 16 | Ht 71.0 in | Wt 180.5 lb

## 2018-05-02 DIAGNOSIS — Z0000 Encounter for general adult medical examination without abnormal findings: Secondary | ICD-10-CM | POA: Diagnosis not present

## 2018-05-02 DIAGNOSIS — Z125 Encounter for screening for malignant neoplasm of prostate: Secondary | ICD-10-CM

## 2018-05-02 LAB — COMPREHENSIVE METABOLIC PANEL
ALBUMIN: 4.1 g/dL (ref 3.5–5.2)
ALK PHOS: 79 U/L (ref 39–117)
ALT: 15 U/L (ref 0–53)
AST: 24 U/L (ref 0–37)
BUN: 14 mg/dL (ref 6–23)
CO2: 32 mEq/L (ref 19–32)
CREATININE: 0.97 mg/dL (ref 0.40–1.50)
Calcium: 9.4 mg/dL (ref 8.4–10.5)
Chloride: 105 mEq/L (ref 96–112)
GFR: 83.34 mL/min (ref >60.00)
GLUCOSE: 96 mg/dL (ref 70–99)
Potassium: 5.2 mEq/L — ABNORMAL HIGH (ref 3.5–5.1)
SODIUM: 142 meq/L (ref 135–145)
TOTAL PROTEIN: 6.2 g/dL (ref 6.0–8.3)
Total Bilirubin: 0.7 mg/dL (ref 0.2–1.2)

## 2018-05-02 LAB — CBC WITH DIFFERENTIAL/PLATELET
BASOS PCT: 0.9 % (ref 0.0–3.0)
Basophils Absolute: 0.1 10*3/uL (ref 0.0–0.1)
EOS PCT: 8.9 % — AB (ref 0.0–5.0)
Eosinophils Absolute: 0.5 10*3/uL (ref 0.0–0.7)
HCT: 46.5 % (ref 39.0–52.0)
HEMOGLOBIN: 15.7 g/dL (ref 13.0–17.0)
LYMPHS ABS: 2.4 10*3/uL (ref 0.7–4.0)
Lymphocytes Relative: 45.2 % (ref 12.0–46.0)
MCHC: 33.8 g/dL (ref 30.0–36.0)
MCV: 96.2 fl (ref 78.0–100.0)
MONO ABS: 0.5 10*3/uL (ref 0.1–1.0)
Monocytes Relative: 9.6 % (ref 3.0–12.0)
NEUTROS ABS: 1.9 10*3/uL (ref 1.4–7.7)
NEUTROS PCT: 35.4 % — AB (ref 43.0–77.0)
PLATELETS: 160 10*3/uL (ref 150.0–400.0)
RBC: 4.83 Mil/uL (ref 4.22–5.81)
RDW: 13.3 % (ref 11.5–15.5)
WBC: 5.3 10*3/uL (ref 4.0–10.5)

## 2018-05-02 LAB — PSA: PSA: 1.18 ng/mL (ref 0.10–4.00)

## 2018-05-02 LAB — LIPID PANEL
CHOLESTEROL: 187 mg/dL (ref 0–200)
HDL: 46.3 mg/dL (ref >39.00)
LDL CALC: 122 mg/dL — AB (ref 0–99)
NonHDL: 140.42
TRIGLYCERIDES: 90 mg/dL (ref 0.0–149.0)
Total CHOL/HDL Ratio: 4
VLDL: 18 mg/dL (ref 0.0–40.0)

## 2018-05-02 LAB — TSH: TSH: 3.99 u[IU]/mL (ref 0.35–4.50)

## 2018-05-02 NOTE — Patient Instructions (Signed)
GO TO THE LAB : Get the blood work     GO TO THE FRONT DESK Schedule your next appointment   for a physical exam in 1 year 

## 2018-05-02 NOTE — Progress Notes (Signed)
Subjective:    Patient ID: Christian Mcgrath, male    DOB: 1956-02-21, 62 y.o.   MRN: [id number]  DOS:  05/02/2018 Type of visit - description : cpx Interval history: No major concerns   Review of Systems [place] fasciitis, symptoms resolved  Other than above, a 14 point review of systems is negative    Past Medical History:  Diagnosis Date  . Abnormal ECG 08/04/2012  . BCC (basal cell carcinoma of skin)    06-2010, sees derm  . Colon polyps     Past Surgical History:  Procedure Laterality Date  . COLONOSCOPY    . HERNIA REPAIR     LEFT  . KNEE ARTHROSCOPY     Left, age 56    Social History   Socioeconomic History  . Marital status: Married    Spouse name: Not on file  . Number of children: 3  . Years of education: Not on file  . Highest education level: Not on file  Occupational History  . Occupation: Press photographer for [employer]: [place]  . Financial resource strain: Not on file  . Food insecurity:    Worry: Not on file    Inability: Not on file  . Transportation needs:    Medical: Not on file    Non-medical: Not on file  Tobacco Use  . Smoking status: Never Smoker  . Smokeless tobacco: Never Used  Substance and Sexual Activity  . Alcohol use: Yes    Alcohol/week: 2.4 oz    Types: 4 Cans of beer per week    Comment: 4 beers weekends  . Drug use: No  . Sexual activity: Not on file  Lifestyle  . Physical activity:    Days per week: Not on file    Minutes per session: Not on file  . Stress: Not on file  Relationships  . Social connections:    Talks on phone: Not on file    Gets together: Not on file    Attends religious service: Not on file    Active member of club or organization: Not on file    Attends meetings of clubs or organizations: Not on file    Relationship status: Not on file  . Intimate partner violence:    Fear of current or ex partner: Not on file    Emotionally abused: Not on file    Physically abused: Not on file   Forced sexual activity: Not on file  Other Topics Concern  . Not on file  Social History Narrative   Works for [employer]   Mother 58 y/o, Father 3y/o               Family History  Problem Relation Age of Onset  . Prostate cancer Father        Dx age in early 56s  . Cancer Father        oral, not a smoker   . Coronary artery disease Neg Hx   . Hypertension Neg Hx   . Stroke Neg Hx   . Diabetes Neg Hx   . Colon cancer Neg Hx      Allergies as of 05/02/2018      Reactions   Penicillins Other (See Comments)   "never had,Dad had a reaction"      Medication List    as of 05/02/2018 11:59 PM   You have not been prescribed any medications.        Objective:  Physical Exam BP 124/68 (BP Location: Left Arm, Patient Position: Sitting, Cuff Size: Small)   Pulse (!) 49   Temp 97.8 F (36.6 C) (Oral)   Resp 16   Ht 5\' 11"  (1.803 m)   Wt 180 lb 8 oz (81.9 kg)   SpO2 98%   BMI 25.17 kg/m  General: Well developed, NAD, see BMI.  Neck: No  thyromegaly  HEENT:  Normocephalic . Face symmetric, atraumatic Lungs:  CTA B Normal respiratory effort, no intercostal retractions, no accessory muscle use. Heart: RRR,  no murmur.  No pretibial edema bilaterally  Abdomen:  Not distended, soft, non-tender. No rebound or rigidity.   Skin: Exposed areas without rash. Not pale. Not jaundice Rectal: External abnormalities: none. Normal sphincter tone. No rectal masses or tenderness.  No stools found Prostate: Prostate gland firm and smooth, minimal enlargement but no nodularity, tenderness, mass, asymmetry or induration Neurologic:  alert & oriented X3.  Speech normal, gait appropriate for age and unassisted Strength symmetric and appropriate for age.  Psych: Cognition and judgment appear intact.  Cooperative with normal attention span and concentration.  Behavior appropriate. No anxious or depressed appearing.     Assessment & Plan:   Assessment : [facility] 2011 sees  dermatology Abnormal EKG 2013: Saw Dr. Caryl Comes 2013, ? Brugadae, type II pattern. No symptoms, no further eval indicated , echo normal  PLAN: Here for a CPX RTC 1 year

## 2018-05-02 NOTE — Progress Notes (Signed)
Pre visit review using our clinic review tool, if applicable. No additional management support is needed unless otherwise documented below in the visit note. 

## 2018-05-02 NOTE — Assessment & Plan Note (Signed)
--  T d 12-2014; shingles shot discussed   -- prostate ca screening: + FH prostate cancer, father age 62, no symptoms, DRE slightly enlarged prostate, check a PSA -- CCS: Cscope-- 2-08, polyp;  colonoscopy again 08-2012 (-), 10 years. --Diet is healthy. Exercise: He is doing great, getting ready to run a marathon in October in [place] --Labs:  FLP, CBC, CMP, TSH, PSA

## 2018-05-03 NOTE — Assessment & Plan Note (Signed)
Here for a CPX RTC 1 year

## 2018-06-28 DIAGNOSIS — D1801 Hemangioma of skin and subcutaneous tissue: Secondary | ICD-10-CM | POA: Diagnosis not present

## 2018-06-28 DIAGNOSIS — L821 Other seborrheic keratosis: Secondary | ICD-10-CM | POA: Diagnosis not present

## 2018-06-28 DIAGNOSIS — L814 Other melanin hyperpigmentation: Secondary | ICD-10-CM | POA: Diagnosis not present

## 2018-06-28 DIAGNOSIS — Z86018 Personal history of other benign neoplasm: Secondary | ICD-10-CM | POA: Diagnosis not present

## 2018-09-12 ENCOUNTER — Telehealth: Payer: Self-pay

## 2018-09-12 NOTE — Telephone Encounter (Signed)
Patient agreed to flu vaccine this Friday. Added on notes.

## 2018-09-12 NOTE — Telephone Encounter (Signed)
Copied from [place] [id number]. Topic: Appointment Scheduling - Scheduling Inquiry for Clinic >> Sep 12, 2018  1:41 PM Bea Graff, NT wrote: Reason for CRM: Pt would like to schedule for his shingles vaccines. Please advise.

## 2018-09-12 NOTE — Telephone Encounter (Signed)
Okay to schedule nurse visit at his convenience.

## 2018-09-12 NOTE — Telephone Encounter (Signed)
Called pt and asked him to call us back to see if he wanted to do flu shot and shingles together on 09/16/18 or make a separate appt for shingles. Per Dr. Lorelei Pont ok to have both flu and shingles shot together.

## 2018-09-16 ENCOUNTER — Ambulatory Visit (INDEPENDENT_AMBULATORY_CARE_PROVIDER_SITE_OTHER): Payer: BLUE CROSS/BLUE SHIELD

## 2018-09-16 DIAGNOSIS — Z23 Encounter for immunization: Secondary | ICD-10-CM

## 2018-11-10 ENCOUNTER — Ambulatory Visit (INDEPENDENT_AMBULATORY_CARE_PROVIDER_SITE_OTHER): Payer: BLUE CROSS/BLUE SHIELD

## 2018-11-10 DIAGNOSIS — Z23 Encounter for immunization: Secondary | ICD-10-CM

## 2018-11-10 NOTE — Progress Notes (Signed)
Patient here today for second Zoster recombinat (shingrix vaccine).  Injection was administered on left deltoid without any problems. Patient tolerated well patient was let go after ten minutes in good condition.

## 2019-01-09 DIAGNOSIS — D1801 Hemangioma of skin and subcutaneous tissue: Secondary | ICD-10-CM | POA: Diagnosis not present

## 2019-01-09 DIAGNOSIS — L570 Actinic keratosis: Secondary | ICD-10-CM | POA: Diagnosis not present

## 2019-01-09 DIAGNOSIS — L821 Other seborrheic keratosis: Secondary | ICD-10-CM | POA: Diagnosis not present

## 2019-01-09 DIAGNOSIS — Z86018 Personal history of other benign neoplasm: Secondary | ICD-10-CM | POA: Diagnosis not present

## 2019-05-04 ENCOUNTER — Other Ambulatory Visit: Payer: Self-pay

## 2019-05-04 ENCOUNTER — Ambulatory Visit (INDEPENDENT_AMBULATORY_CARE_PROVIDER_SITE_OTHER): Payer: BC Managed Care – PPO | Admitting: Internal Medicine

## 2019-05-04 ENCOUNTER — Encounter: Payer: Self-pay | Admitting: Internal Medicine

## 2019-05-04 VITALS — BP 123/78 | HR 49 | Temp 98.3°F | Resp 16 | Ht 71.0 in | Wt 184.5 lb

## 2019-05-04 DIAGNOSIS — R9431 Abnormal electrocardiogram [ECG] [EKG]: Secondary | ICD-10-CM

## 2019-05-04 DIAGNOSIS — Z0000 Encounter for general adult medical examination without abnormal findings: Secondary | ICD-10-CM

## 2019-05-04 LAB — CBC WITH DIFFERENTIAL/PLATELET
Basophils Absolute: 0 10*3/uL (ref 0.0–0.1)
Basophils Relative: 1 % (ref 0.0–3.0)
Eosinophils Absolute: 0.2 10*3/uL (ref 0.0–0.7)
Eosinophils Relative: 5.9 % — ABNORMAL HIGH (ref 0.0–5.0)
HCT: 43.5 % (ref 39.0–52.0)
Hemoglobin: 14.6 g/dL (ref 13.0–17.0)
Lymphocytes Relative: 47.1 % — ABNORMAL HIGH (ref 12.0–46.0)
Lymphs Abs: 1.9 10*3/uL (ref 0.7–4.0)
MCHC: 33.6 g/dL (ref 30.0–36.0)
MCV: 96.3 fl (ref 78.0–100.0)
Monocytes Absolute: 0.4 10*3/uL (ref 0.1–1.0)
Monocytes Relative: 9.6 % (ref 3.0–12.0)
Neutro Abs: 1.5 10*3/uL (ref 1.4–7.7)
Neutrophils Relative %: 36.4 % — ABNORMAL LOW (ref 43.0–77.0)
Platelets: 167 10*3/uL (ref 150.0–400.0)
RBC: 4.51 Mil/uL (ref 4.22–5.81)
RDW: 13.4 % (ref 11.5–15.5)
WBC: 4 10*3/uL (ref 4.0–10.5)

## 2019-05-04 LAB — LIPID PANEL
Cholesterol: 173 mg/dL (ref 0–200)
HDL: 43.5 mg/dL (ref >39.00)
LDL Cholesterol: 112 mg/dL — ABNORMAL HIGH (ref 0–99)
NonHDL: 129.26
Total CHOL/HDL Ratio: 4
Triglycerides: 85 mg/dL (ref 0.0–149.0)
VLDL: 17 mg/dL (ref 0.0–40.0)

## 2019-05-04 LAB — COMPREHENSIVE METABOLIC PANEL
ALT: 12 U/L (ref 0–53)
AST: 18 U/L (ref 0–37)
Albumin: 4 g/dL (ref 3.5–5.2)
Alkaline Phosphatase: 79 U/L (ref 39–117)
BUN: 18 mg/dL (ref 6–23)
CO2: 31 mEq/L (ref 19–32)
Calcium: 8.9 mg/dL (ref 8.4–10.5)
Chloride: 106 mEq/L (ref 96–112)
Creatinine, Ser: 0.92 mg/dL (ref 0.40–1.50)
GFR: 83.07 mL/min (ref >60.00)
Glucose, Bld: 94 mg/dL (ref 70–99)
Potassium: 4.6 mEq/L (ref 3.5–5.1)
Sodium: 141 mEq/L (ref 135–145)
Total Bilirubin: 0.6 mg/dL (ref 0.2–1.2)
Total Protein: 6 g/dL (ref 6.0–8.3)

## 2019-05-04 LAB — TSH: TSH: 3.05 u[IU]/mL (ref 0.35–4.50)

## 2019-05-04 LAB — PSA: PSA: 0.83 ng/mL (ref 0.10–4.00)

## 2019-05-04 NOTE — Patient Instructions (Signed)
Get the blood work     [place] Schedule your next appointment  For a physical in 1 year     Get flu shot early this year  Enjoy retirement!

## 2019-05-04 NOTE — Progress Notes (Signed)
Subjective:    Patient ID: Christian Mcgrath, male    DOB: 1956/10/13, 63 y.o.   MRN: [id number]  DOS:  05/04/2019 Type of visit - description: cpx No concerns, feeling well   Review of Systems  A 14 point review of systems is negative    Past Medical History:  Diagnosis Date  . Abnormal ECG 08/04/2012  . BCC (basal cell carcinoma of skin)    06-2010, sees derm  . Colon polyps     Past Surgical History:  Procedure Laterality Date  . COLONOSCOPY    . HERNIA REPAIR     LEFT  . KNEE ARTHROSCOPY     Left, age 22    Social History   Socioeconomic History  . Marital status: Married    Spouse name: Not on file  . Number of children: 3  . Years of education: Not on file  . Highest education level: Not on file  Occupational History  . Occupation: Press photographer for [employer]: [place]  . Financial resource strain: Not on file  . Food insecurity    Worry: Not on file    Inability: Not on file  . Transportation needs    Medical: Not on file    Non-medical: Not on file  Tobacco Use  . Smoking status: Never Smoker  . Smokeless tobacco: Never Used  Substance and Sexual Activity  . Alcohol use: Yes    Alcohol/week: 4.0 standard drinks    Types: 4 Cans of beer per week    Comment: 4 beers weekends  . Drug use: No  . Sexual activity: Not on file  Lifestyle  . Physical activity    Days per week: Not on file    Minutes per session: Not on file  . Stress: Not on file  Relationships  . Social Herbalist on phone: Not on file    Gets together: Not on file    Attends religious service: Not on file    Active member of club or organization: Not on file    Attends meetings of clubs or organizations: Not on file    Relationship status: Not on file  . Intimate partner violence    Fear of current or ex partner: Not on file    Emotionally abused: Not on file    Physically abused: Not on file    Forced sexual activity: Not on file  Other Topics Concern   . Not on file  Social History Narrative   Works for [employer]   Mother 75 y/o, Father 7y/o               Family History  Problem Relation Age of Onset  . Prostate cancer Father        Dx age in early 35s  . Cancer Father        oral, not a smoker   . Coronary artery disease Neg Hx   . Hypertension Neg Hx   . Stroke Neg Hx   . Diabetes Neg Hx   . Colon cancer Neg Hx      Allergies as of 05/04/2019      Reactions   Penicillins Other (See Comments)   "never had,Dad had a reaction"      Medication List    as of May 04, 2019 11:59 PM   You have not been prescribed any medications.         Objective:   Physical  Exam BP 123/78 (BP Location: Left Arm, Patient Position: Sitting, Cuff Size: Small)   Pulse (!) 49   Temp 98.3 F (36.8 C) (Oral)   Resp 16   Ht 5\' 11"  (1.803 m)   Wt 184 lb 8 oz (83.7 kg)   SpO2 99%   BMI 25.73 kg/m  General: Well developed, NAD, BMI noted Neck: No  thyromegaly  HEENT:  Normocephalic . Face symmetric, atraumatic Lungs:  CTA B Normal respiratory effort, no intercostal retractions, no accessory muscle use. Heart: RRR,  no murmur.  No pretibial edema bilaterally  Abdomen:  Not distended, soft, non-tender. No rebound or rigidity.   Skin: Exposed areas without rash. Not pale. Not jaundice Neurologic:  alert & oriented X3.  Speech normal, gait appropriate for age and unassisted Strength symmetric and appropriate for age.  Psych: Cognition and judgment appear intact.  Cooperative with normal attention span and concentration.  Behavior appropriate. No anxious or depressed appearing.     Assessment      Assessment : [facility] 2011 sees dermatology Abnormal EKG 2013: Saw Dr. Caryl Comes 2013, ? Brugadae, type II pattern. No symptoms, no further eval indicated , echo normal  PLAN: Here for CPX History of abnormal EKG: No CV symptoms, EKG at baseline, no acute changes, sinus bradycardia noted. Social: Retiring in few weeks. RTC 1 year

## 2019-05-04 NOTE — Assessment & Plan Note (Addendum)
--  T d 12-2014; s/p shingrex x 2; rec a flu shot this year   -- prostate ca screening: + FH prostate cancer, father age 63, no symptoms, DRE and PSA were done 2019, pt request a PSA this year, will do -- CCS: Cscope-- 2-08, polyp;  colonoscopy again 08-2012 (-), 10 years. --Diet is healthy.  doing great, run a marathon in October 2019 in [place]; bikes-swims regulalrly --Labs:   cmp flp cbc tsh psa

## 2019-05-04 NOTE — Progress Notes (Signed)
Pre visit review using our clinic review tool, if applicable. No additional management support is needed unless otherwise documented below in the visit note. 

## 2019-05-05 NOTE — Assessment & Plan Note (Signed)
Here for CPX History of abnormal EKG: No CV symptoms, EKG at baseline, no acute changes, sinus bradycardia noted. Social: Retiring in few weeks. RTC 1 year

## 2020-05-08 ENCOUNTER — Encounter: Payer: BC Managed Care – PPO | Admitting: Internal Medicine

## 2020-05-10 ENCOUNTER — Other Ambulatory Visit: Payer: Self-pay

## 2020-05-10 ENCOUNTER — Ambulatory Visit (INDEPENDENT_AMBULATORY_CARE_PROVIDER_SITE_OTHER): Payer: No Typology Code available for payment source | Admitting: Internal Medicine

## 2020-05-10 ENCOUNTER — Encounter: Payer: Self-pay | Admitting: Internal Medicine

## 2020-05-10 VITALS — BP 113/73 | HR 56 | Temp 98.4°F | Resp 16 | Ht 71.0 in | Wt 182.1 lb

## 2020-05-10 DIAGNOSIS — Z0000 Encounter for general adult medical examination without abnormal findings: Secondary | ICD-10-CM

## 2020-05-10 DIAGNOSIS — Z125 Encounter for screening for malignant neoplasm of prostate: Secondary | ICD-10-CM

## 2020-05-10 LAB — LIPID PANEL
Cholesterol: 185 mg/dL (ref 0–200)
HDL: 45.5 mg/dL (ref >39.00)
LDL Cholesterol: 125 mg/dL — ABNORMAL HIGH (ref 0–99)
NonHDL: 139.19
Total CHOL/HDL Ratio: 4
Triglycerides: 71 mg/dL (ref 0.0–149.0)
VLDL: 14.2 mg/dL (ref 0.0–40.0)

## 2020-05-10 LAB — COMPREHENSIVE METABOLIC PANEL
ALT: 15 U/L (ref 0–53)
AST: 22 U/L (ref 0–37)
Albumin: 4.3 g/dL (ref 3.5–5.2)
Alkaline Phosphatase: 70 U/L (ref 39–117)
BUN: 15 mg/dL (ref 6–23)
CO2: 31 mEq/L (ref 19–32)
Calcium: 9.2 mg/dL (ref 8.4–10.5)
Chloride: 103 mEq/L (ref 96–112)
Creatinine, Ser: 0.99 mg/dL (ref 0.40–1.50)
GFR: 76.09 mL/min (ref >60.00)
Glucose, Bld: 93 mg/dL (ref 70–99)
Potassium: 4.6 mEq/L (ref 3.5–5.1)
Sodium: 139 mEq/L (ref 135–145)
Total Bilirubin: 0.7 mg/dL (ref 0.2–1.2)
Total Protein: 6.4 g/dL (ref 6.0–8.3)

## 2020-05-10 LAB — CBC WITH DIFFERENTIAL/PLATELET
Basophils Absolute: 0.1 10*3/uL (ref 0.0–0.1)
Basophils Relative: 1.4 % (ref 0.0–3.0)
Eosinophils Absolute: 0.2 10*3/uL (ref 0.0–0.7)
Eosinophils Relative: 4.2 % (ref 0.0–5.0)
HCT: 43.9 % (ref 39.0–52.0)
Hemoglobin: 14.9 g/dL (ref 13.0–17.0)
Lymphocytes Relative: 35.5 % (ref 12.0–46.0)
Lymphs Abs: 2 10*3/uL (ref 0.7–4.0)
MCHC: 33.9 g/dL (ref 30.0–36.0)
MCV: 94.2 fl (ref 78.0–100.0)
Monocytes Absolute: 0.5 10*3/uL (ref 0.1–1.0)
Monocytes Relative: 9.2 % (ref 3.0–12.0)
Neutro Abs: 2.8 10*3/uL (ref 1.4–7.7)
Neutrophils Relative %: 49.7 % (ref 43.0–77.0)
Platelets: 160 10*3/uL (ref 150.0–400.0)
RBC: 4.66 Mil/uL (ref 4.22–5.81)
RDW: 13 % (ref 11.5–15.5)
WBC: 5.7 10*3/uL (ref 4.0–10.5)

## 2020-05-10 LAB — PSA: PSA: 1.15 ng/mL (ref 0.10–4.00)

## 2020-05-10 LAB — TSH: TSH: 4.23 u[IU]/mL (ref 0.35–4.50)

## 2020-05-10 NOTE — Progress Notes (Signed)
   Subjective:    Patient ID: Christian Mcgrath, male    DOB: [birth year], 64 y.o.   MRN: [id number]  DOS:  05/10/2020 Type of visit - description: cpx No concerns, remains extremely active without any symptoms  Review of Systems   A 14 point review of systems is negative    Past Medical History:  Diagnosis Date  . Abnormal ECG 08/04/2012  . BCC (basal cell carcinoma of skin)    06-2010, sees derm  . Colon polyps     Past Surgical History:  Procedure Laterality Date  . COLONOSCOPY    . HERNIA REPAIR     LEFT  . KNEE ARTHROSCOPY     Left, age 69    Allergies as of 05/10/2020      Reactions   Penicillins Other (See Comments)   "never had,Dad had a reaction"      Medication List    as of May 10, 2020 11:59 PM   You have not been prescribed any medications.        Objective:   Physical Exam BP 113/73 (BP Location: Right Arm, Patient Position: Sitting, Cuff Size: Small)   Pulse (!) 56   Temp 98.4 F (36.9 C) (Oral)   Resp 16   Ht 5\' 11"  (1.803 m)   Wt 182 lb 2 oz (82.6 kg)   SpO2 97%   BMI 25.40 kg/m  General: Well developed, NAD, BMI noted Neck: No  thyromegaly  HEENT:  Normocephalic . Face symmetric, atraumatic Lungs:  CTA B Normal respiratory effort, no intercostal retractions, no accessory muscle use. Heart: RRR,  no murmur.  Abdomen:  Not distended, soft, non-tender. No rebound or rigidity.   Lower extremities: no pretibial edema bilaterally  DRE: Normal prostate, no stools Skin: Exposed areas without rash. Not pale. Not jaundice Neurologic:  alert & oriented X3.  Speech normal, gait appropriate for age and unassisted Strength symmetric and appropriate for age.  Psych: Cognition and judgment appear intact.  Cooperative with normal attention span and concentration.  Behavior appropriate. No anxious or depressed appearing.     Assessment     Assessment : [facility] 2011 sees dermatology Abnormal EKG 2013: Saw Dr. Caryl Comes 2013, ? Brugadae, type  II pattern. No symptoms, no further eval indicated , echo normal  PLAN: Here for CPX Sees  dermatology regularly RTC 1 year    This visit occurred during the SARS-CoV-2 public health emergency.  Safety protocols were in place, including screening questions prior to the visit, additional usage of staff PPE, and extensive cleaning of exam room while observing appropriate contact time as indicated for disinfecting solutions.

## 2020-05-10 NOTE — Progress Notes (Signed)
Pre visit review using our clinic review tool, if applicable. No additional management support is needed unless otherwise documented below in the visit note. 

## 2020-05-10 NOTE — Patient Instructions (Signed)
With your flu shot this fall  GO TO THE LAB : Get the blood work     Christian Mcgrath, Christian Mcgrath back for a physical exam in 1 year

## 2020-05-11 ENCOUNTER — Encounter: Payer: Self-pay | Admitting: Internal Medicine

## 2020-05-11 NOTE — Assessment & Plan Note (Signed)
Here for CPX Sees  dermatology regularly RTC 1 year

## 2020-05-11 NOTE — Assessment & Plan Note (Signed)
--  T d 12-2014; s/p shingrex x 2; s/p covid vaccines ; rec flu shot q year  -- prostate ca screening: + FH prostate cancer, father age 64, no symptoms, DRE normal today, check PSA .-- CCS: Cscope-- 2-08, polyp;  colonoscopy again 08-2012 (-), 10 years. --Diet is healthy.  Exercise: He is extremely active running, hiking, biking, swimming.   --Labs:  CMP, FLP, CBC, TSH, PSA

## 2021-01-14 ENCOUNTER — Other Ambulatory Visit: Payer: Self-pay | Admitting: Dermatology

## 2021-01-15 LAB — DERMATOPATH , 2 SPECIMENS

## 2021-01-15 LAB — DERMATOPATHOLOGY REPORT

## 2021-05-12 ENCOUNTER — Encounter: Payer: Self-pay | Admitting: Internal Medicine

## 2021-05-28 ENCOUNTER — Ambulatory Visit (INDEPENDENT_AMBULATORY_CARE_PROVIDER_SITE_OTHER): Payer: Medicare Other | Admitting: Internal Medicine

## 2021-05-28 ENCOUNTER — Ambulatory Visit: Payer: Medicare Other | Attending: Internal Medicine

## 2021-05-28 ENCOUNTER — Other Ambulatory Visit: Payer: Self-pay

## 2021-05-28 ENCOUNTER — Encounter: Payer: Self-pay | Admitting: Internal Medicine

## 2021-05-28 VITALS — BP 122/72 | HR 72 | Temp 97.9°F | Resp 16 | Ht 71.0 in | Wt 179.1 lb

## 2021-05-28 DIAGNOSIS — Z8042 Family history of malignant neoplasm of prostate: Secondary | ICD-10-CM

## 2021-05-28 DIAGNOSIS — R9431 Abnormal electrocardiogram [ECG] [EKG]: Secondary | ICD-10-CM | POA: Diagnosis not present

## 2021-05-28 DIAGNOSIS — Z23 Encounter for immunization: Secondary | ICD-10-CM

## 2021-05-28 DIAGNOSIS — Z136 Encounter for screening for cardiovascular disorders: Secondary | ICD-10-CM | POA: Diagnosis not present

## 2021-05-28 DIAGNOSIS — Z7185 Encounter for immunization safety counseling: Secondary | ICD-10-CM

## 2021-05-28 DIAGNOSIS — Z13220 Encounter for screening for lipoid disorders: Secondary | ICD-10-CM

## 2021-05-28 NOTE — Progress Notes (Signed)
   Subjective:    Patient ID: Christian Mcgrath, male    DOB: June 09, 1956, 65 y.o.   MRN: [id number]  DOS:  05/28/2021 Type of visit - description: Routine visit  Since the last office visit is doing well, has no major concerns, he retired, he is enjoying his retirement, he remains very active Today with talk about his family history of prostate cancer. He has a history of abnormal EKG   Review of Systems   A 14 point review of systems is negative    Past Medical History:  Diagnosis Date   Abnormal ECG 08/04/2012   BCC (basal cell carcinoma of skin)    06-2010, sees derm   Colon polyps     Past Surgical History:  Procedure Laterality Date   COLONOSCOPY     HERNIA REPAIR     LEFT   KNEE ARTHROSCOPY     Left, age 26    Allergies as of 05/28/2021       Reactions   Penicillins Other (See Comments)   "never had,Dad had a reaction"        Medication List    as of May 28, 2021  1:14 PM   You have not been prescribed any medications.        Objective:   Physical Exam BP 122/72 (BP Location: Left Arm, Patient Position: Sitting, Cuff Size: Small)   Pulse 72   Temp 97.9 F (36.6 C) (Oral)   Resp 16   Ht '5\' 11"'$  (1.803 m)   Wt 179 lb 2 oz (81.3 kg)   SpO2 97%   BMI 24.98 kg/m  General: Well developed, NAD, BMI noted Neck: No  thyromegaly  HEENT:  Normocephalic . Face symmetric, atraumatic Lungs:  CTA B Normal respiratory effort, no intercostal retractions, no accessory muscle use. Heart: RRR,  no murmur.  Abdomen:  Not distended, soft, non-tender. No rebound or rigidity.   Lower extremities: no pretibial edema bilaterally  Skin: Exposed areas without rash. Not pale. Not jaundice Neurologic:  alert & oriented X3.  Speech normal, gait appropriate for age and unassisted Strength symmetric and appropriate for age.  Psych: Cognition and judgment appear intact.  Cooperative with normal attention span and concentration.  Behavior appropriate. No anxious or  depressed appearing.     Assessment      Assessment : [facility] 2011 sees dermatology Abnormal EKG 2013: Saw Dr. Caryl Comes 2013, ? Brugadae, type II pattern. No symptoms, no further eval indicated , echo normal  PLAN: Routine office visit, History of normal EKG: Labs Family history of prostate cancer: Labs Sees dermatology regularly Preventive care reviewed: See separate documentation RTC 1 year   This visit occurred during the SARS-CoV-2 public health emergency.  Safety protocols were in place, including screening questions prior to the visit, additional usage of staff PPE, and extensive cleaning of exam room while observing appropriate contact time as indicated for disinfecting solutions.

## 2021-05-28 NOTE — Progress Notes (Signed)
   Covid-19 Vaccination Clinic  Name:  Christian Mcgrath    MRN: [id number] DOB: [birth year]  05/28/2021  [name] was observed post Covid-19 immunization for 15 minutes without incident. He was provided with Vaccine Information Sheet and instruction to access the V-Safe system.   [name] was instructed to call 911 with any severe reactions post vaccine: Difficulty breathing  Swelling of face and throat  A fast heartbeat  A bad rash all over body  Dizziness and weakness   Immunizations Administered     Name Date Dose VIS Date Route   PFIZER Comrnaty(Gray TOP) Covid-19 Vaccine 05/28/2021  1:55 PM 0.3 mL 10/03/2020 Intramuscular   Manufacturer: [employer]   Lot: [id number]   NDC: [phone]

## 2021-05-28 NOTE — Patient Instructions (Addendum)
Recommend a COVID-vaccine booster at your convenience  Recommend a flu shot this fall  GO TO THE LAB : Get the blood work     [place], [place] back for a physical exam in 1 year     "Living will", "[place] of attorney": Advanced care planning  (If you already have a living will or healthcare power of attorney, please bring the copy to be scanned in your chart.)  Advance care planning is a process that supports adults in  understanding and sharing their preferences regarding future medical care.   The patient's preferences are recorded in documents called Advance Directives.    Advanced directives are completed (and can be modified at any time) while the patient is in full mental capacity.   The documentation should be available at all times to the patient, the family and the healthcare providers.  Bring in a copy to be scanned in your chart is an excellent idea and is recommended   This legal documents direct treatment decision making and/or appoint a surrogate to make the decision if the patient is not capable to do so.    Advance directives can be documented in many types of formats,  documents have names such as:  Lliving will  Durable power of attorney for healthcare (healthcare proxy or healthcare power of attorney)  Combined directives  Physician orders for life-sustaining treatment    More information at:  [email or web address]

## 2021-05-29 ENCOUNTER — Other Ambulatory Visit (HOSPITAL_BASED_OUTPATIENT_CLINIC_OR_DEPARTMENT_OTHER): Payer: Self-pay

## 2021-05-29 LAB — CBC WITH DIFFERENTIAL/PLATELET
Basophils Absolute: 0 10*3/uL (ref 0.0–0.1)
Basophils Relative: 0.5 % (ref 0.0–3.0)
Eosinophils Absolute: 0.1 10*3/uL (ref 0.0–0.7)
Eosinophils Relative: 1.4 % (ref 0.0–5.0)
HCT: 48 % (ref 39.0–52.0)
Hemoglobin: 16 g/dL (ref 13.0–17.0)
Lymphocytes Relative: 22.4 % (ref 12.0–46.0)
Lymphs Abs: 1.8 10*3/uL (ref 0.7–4.0)
MCHC: 33.3 g/dL (ref 30.0–36.0)
MCV: 93.7 fl (ref 78.0–100.0)
Monocytes Absolute: 0.5 10*3/uL (ref 0.1–1.0)
Monocytes Relative: 6.4 % (ref 3.0–12.0)
Neutro Abs: 5.4 10*3/uL (ref 1.4–7.7)
Neutrophils Relative %: 69.3 % (ref 43.0–77.0)
Platelets: 199 10*3/uL (ref 150.0–400.0)
RBC: 5.12 Mil/uL (ref 4.22–5.81)
RDW: 14.6 % (ref 11.5–15.5)
WBC: 7.8 10*3/uL (ref 4.0–10.5)

## 2021-05-29 LAB — LIPID PANEL
Cholesterol: 213 mg/dL — ABNORMAL HIGH (ref 0–200)
HDL: 55.6 mg/dL (ref >39.00)
LDL Cholesterol: 143 mg/dL — ABNORMAL HIGH (ref 0–99)
NonHDL: 157.07
Total CHOL/HDL Ratio: 4
Triglycerides: 72 mg/dL (ref 0.0–149.0)
VLDL: 14.4 mg/dL (ref 0.0–40.0)

## 2021-05-29 LAB — COMPREHENSIVE METABOLIC PANEL
ALT: 13 U/L (ref 0–53)
AST: 24 U/L (ref 0–37)
Albumin: 4.6 g/dL (ref 3.5–5.2)
Alkaline Phosphatase: 77 U/L (ref 39–117)
BUN: 18 mg/dL (ref 6–23)
CO2: 27 mEq/L (ref 19–32)
Calcium: 10 mg/dL (ref 8.4–10.5)
Chloride: 103 mEq/L (ref 96–112)
Creatinine, Ser: 1.24 mg/dL (ref 0.40–1.50)
GFR: 61.17 mL/min (ref >60.00)
Glucose, Bld: 79 mg/dL (ref 70–99)
Potassium: 5.2 mEq/L — ABNORMAL HIGH (ref 3.5–5.1)
Sodium: 141 mEq/L (ref 135–145)
Total Bilirubin: 1 mg/dL (ref 0.2–1.2)
Total Protein: 7.2 g/dL (ref 6.0–8.3)

## 2021-05-29 LAB — PSA: PSA: 1.38 ng/mL (ref 0.10–4.00)

## 2021-05-29 LAB — TSH: TSH: 2.36 u[IU]/mL (ref 0.35–5.50)

## 2021-05-29 MED ORDER — COVID-19 MRNA VAC-TRIS(PFIZER) 30 MCG/0.3ML IM SUSP
INTRAMUSCULAR | 0 refills | Status: DC
  Filled 2021-05-29: qty 0.3, 1d supply, fill #0

## 2021-05-29 NOTE — Assessment & Plan Note (Signed)
Routine office visit, History of normal EKG: Labs Family history of prostate cancer: Labs Sees dermatology regularly Preventive care reviewed: See separate documentation RTC 1 year

## 2021-05-29 NOTE — Assessment & Plan Note (Signed)
--  T d 12-2014; s/p shingrex x 2 - PNM 20 today (pneumonia shot completed per guidelines) - s/p covid vax x 3, booster rec -rec flu shot q year -- prostate ca screening: + FH prostate cancer, father age 65, no symptoms, DRE normal 2021,  check PSA -- CCS: Cscope-- 2-08, polyp;  colonoscopy again 08-2012 (-), 10 years. --Lifestyle: Continue to do well, exercise regularly, diet is healthy. - POA  discussed

## 2021-07-16 ENCOUNTER — Telehealth (INDEPENDENT_AMBULATORY_CARE_PROVIDER_SITE_OTHER): Payer: Medicare Other | Admitting: Registered Nurse

## 2021-07-16 ENCOUNTER — Encounter: Payer: Self-pay | Admitting: Registered Nurse

## 2021-07-16 ENCOUNTER — Other Ambulatory Visit: Payer: Self-pay

## 2021-07-16 ENCOUNTER — Other Ambulatory Visit: Payer: Self-pay | Admitting: Registered Nurse

## 2021-07-16 ENCOUNTER — Telehealth: Payer: Medicare Other | Admitting: Family Medicine

## 2021-07-16 DIAGNOSIS — J019 Acute sinusitis, unspecified: Secondary | ICD-10-CM

## 2021-07-16 MED ORDER — AZELASTINE-FLUTICASONE 137-50 MCG/ACT NA SUSP: 1.0000 | Freq: Two times a day (BID) | NASAL | 0 refills | Status: DC

## 2021-07-16 MED ORDER — AZITHROMYCIN 250 MG PO TABS: ORAL_TABLET | ORAL | 0 refills | Status: AC

## 2021-07-16 NOTE — Patient Instructions (Signed)
° ° ° °  If you have lab work done today you will be contacted with your lab results within the next 2 weeks.  If you have not heard from us then please contact us. The fastest way to get your results is to register for My Chart. ° ° °IF you received an x-ray today, you will receive an invoice from [facility]. Please contact [facility] at [phone] with questions or concerns regarding your invoice.  ° °IF you received labwork today, you will receive an invoice from [facility]. Please contact [facility] at [phone] with questions or concerns regarding your invoice.  ° °Our billing staff will not be able to assist you with questions regarding bills from these companies. ° °You will be contacted with the lab results as soon as they are available. The fastest way to get your results is to activate your My Chart account. Instructions are located on the last page of this paperwork. If you have not heard from us regarding the results in 2 weeks, please contact this office. °  ° ° ° °

## 2021-07-16 NOTE — Progress Notes (Signed)
Telemedicine Encounter- SOAP NOTE Established Patient  This telephone encounter was conducted with the patient's (or proxy's) verbal consent via audio telecommunications: yes/no: Yes Patient was instructed to have this encounter in a suitably private space; and to only have persons present to whom they give permission to participate. In addition, patient identity was confirmed by use of name plus two identifiers (DOB and address).  I discussed the limitations, risks, security and privacy concerns of performing an evaluation and management service by telephone and the availability of in person appointments. I also discussed with the patient that there may be a patient responsible charge related to this service. The patient expressed understanding and agreed to proceed.  I spent a total of 15 minutes talking with the patient or their proxy.  Patient at home Provider in office  Participants: Kathrin Ruddy, NP and [name]  Chief Complaint  Patient presents with   Sinus Problem    Patient states he is experiencing some sinus symptoms, head pressure, cough, chills. Sweats and low grade fever. Patient has been taking different otc medications. Patient state when he bend over his eyes feels like his eyes feel crazy. Patient wife tested positive for covid on last Tuesday and he has been taking test and has been negative.    Subjective   STORY Christian Mcgrath is a 65 y.o. established patient. Telephone visit today for sinus  HPI Sinus pressure, facial pressure worse when bending over, cough, low grade temp, chills. Frequent sinusitis in past, feels similar Notes wife tested positive for COVID last week, he has consistently tested negative A lot of time outdoors - sometimes can handle symptoms with claritin, but ineffective this time.  No nvd, shob, doe, chest pain  Patient Active Problem List   Diagnosis Date Noted   PCP NOTES >>>> 09/27/2015   Hemorrhoids, Internal and external  01/29/2014   Abnormal ECG 08/04/2012   Annual physical exam 06/26/2011    Past Medical History:  Diagnosis Date   Abnormal ECG 08/04/2012   BCC (basal cell carcinoma of skin)    06-2010, sees derm   Colon polyps     Current Outpatient Medications  Medication Sig Dispense Refill   Azelastine-Fluticasone 137-50 MCG/ACT SUSP Place 1 spray into the nose every 12 (twelve) hours. 23 g 0   azithromycin (ZITHROMAX) 250 MG tablet Take 2 tablets on day 1, then 1 tablet daily on days 2 through 5 6 tablet 0   COVID-19 mRNA Vac-TriS, Pfizer, SUSP injection Inject into the muscle. (Patient not taking: Reported on 07/16/2021) 0.3 mL 0   No current facility-administered medications for this visit.    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    Employer: [employer]  Tobacco Use   Smoking status: Never   Smokeless tobacco: Never  Substance and Sexual Activity   Alcohol use: Yes    Alcohol/week: 4.0 standard drinks    Types: 4 Cans of beer per week    Comment: 4 beers weekends   Drug use: No   Sexual activity: Not on file  Other Topics Concern   Not on file  Social History Narrative   Mother 74 y/o, Father 67y/o             Social Determinants of Radio broadcast assistant Strain: Not on file  Food Insecurity: Not on file  Transportation Needs: Not on file  Physical Activity: Not on file  Stress: Not on file  Social Connections: Not on file  Intimate Partner Violence: Not on file    ROS Per hpi   Objective   Vitals as reported by the patient: There were no vitals filed for this visit.  Razi was seen today for sinus problem.  Diagnoses and all orders for  this visit:  Acute sinusitis, recurrence not specified, unspecified location -     azithromycin (ZITHROMAX) 250 MG tablet; Take 2 tablets on day 1, then 1 tablet daily on days 2 through 5 -     Azelastine-Fluticasone 137-50 MCG/ACT SUSP; Place 1 spray into the nose every 12 (twelve) hours.   PLAN Given history and symptoms, will treat with z pack and nasal spray as above Return if worsening or failing to improve If he tests positive for covid we can pursue antiviral therapy, though discussed that his peak time for infection has passed Patient encouraged to call clinic with any questions, comments, or concerns.  I discussed the assessment and treatment plan with the patient. The patient was provided an opportunity to ask questions and all were answered. The patient agreed with the plan and demonstrated an understanding of the instructions.   The patient was advised to call back or seek an in-person evaluation if the symptoms worsen or if the condition fails to improve as anticipated.  I provided 15 minutes of non-face-to-face time during this encounter.  Maximiano Coss, NP

## 2021-07-16 NOTE — Telephone Encounter (Signed)
Patient medication is not covered by insurance.

## 2021-07-25 ENCOUNTER — Other Ambulatory Visit: Payer: Self-pay

## 2021-07-25 ENCOUNTER — Other Ambulatory Visit: Payer: Self-pay | Admitting: Family Medicine

## 2021-07-25 ENCOUNTER — Telehealth (INDEPENDENT_AMBULATORY_CARE_PROVIDER_SITE_OTHER): Payer: Medicare Other | Admitting: Family Medicine

## 2021-07-25 DIAGNOSIS — M62838 Other muscle spasm: Secondary | ICD-10-CM | POA: Diagnosis not present

## 2021-07-25 MED ORDER — CYCLOBENZAPRINE HCL 10 MG PO TABS: 10.0000 mg | ORAL_TABLET | Freq: Two times a day (BID) | ORAL | 0 refills | Status: DC | PRN

## 2021-07-25 NOTE — Progress Notes (Signed)
[place] at [facility] [address], [place], [place] [address] [phone]  Date:  07/25/2021   Name:  Christian Mcgrath   DOB:  10-25-1956   MRN:  [id number]  PCP:  Colon Branch, MD    Chief Complaint: No chief complaint on file.   History of Present Illness:  Christian Mcgrath is a 65 y.o. very pleasant male patient who presents with the following:  Virtual visit today for primary pt of Dr Larose Kells, concern of back spasm  Pt location is home, I am at office Pt ID confirmed with 2 factors, he gives consent for virtual visit today Pt and myself are present on the call today  Today he notes about 3 days of back pain Today is  Friday.  Monday he did a long swim- the next day he had pain in his left upper back- seemed to be getting better but got worse after he carried his 7 yo grandson last night He is using advil but not helping Today he has used ice, voltaren gel, heat The area of pain is up high in the left trapezius area- he tried a massage gun and it did help a little   He is otherwise generally in good health He is still able to run, bike without any distress BP 120/80 approx in general   No CP or SOB He does feel like this is muscular, he notes that a "knot" is palpable in the muscle   Patient Active Problem List   Diagnosis Date Noted   PCP NOTES >>>> 09/27/2015   Hemorrhoids, Internal and external 01/29/2014   Abnormal ECG 08/04/2012   Annual physical exam 06/26/2011    Past Medical History:  Diagnosis Date   Abnormal ECG 08/04/2012   BCC (basal cell carcinoma of skin)    06-2010, sees derm   Colon polyps     Past Surgical History:  Procedure Laterality Date   COLONOSCOPY     HERNIA REPAIR     LEFT   KNEE ARTHROSCOPY     Left, age 62    Social History   Tobacco Use   Smoking status: Never   Smokeless tobacco: Never  Substance Use Topics   Alcohol use: Yes    Alcohol/week: 4.0 standard drinks     Types: 4 Cans of beer per week    Comment: 4 beers weekends   Drug use: No    Family History  Problem Relation Age of Onset   Prostate cancer Father        Dx age in early 19s   Cancer Father        oral, not a smoker    Coronary artery disease Neg Hx    Hypertension Neg Hx    Stroke Neg Hx    Diabetes Neg Hx    Colon cancer Neg Hx     Allergies  Allergen Reactions   Penicillins Other (See Comments)    "never had,Dad had a reaction"    Medication list has been reviewed and updated.  Current Outpatient Medications on File Prior to Visit  Medication Sig Dispense Refill   Azelastine-Fluticasone 137-50 MCG/ACT SUSP Place 1 spray into the nose every 12 (twelve) hours. 23 g 0   COVID-19 mRNA Vac-TriS, Pfizer, SUSP injection Inject into the muscle. (Patient not taking: Reported on 07/16/2021) 0.3 mL 0   No current facility-administered medications on file prior to visit.    Review  of Systems:  As per HPI- otherwise negative.   Physical Examination: There were no vitals filed for this visit. There were no vitals filed for this visit. There is no height or weight on file to calculate BMI. Ideal Body Weight:    Pt observed over video monitor- he looks well, no distress  He indicated his left upper back as the site of discomfort  Assessment and Plan: Trapezius muscle spasm - Plan: cyclobenzaprine (FLEXERIL) 10 MG tablet Virtual visit today for back spasm. Pt notes a spasm in the left upper back, he feels confident this is MSK and denies any CP or SOB He runs and bikes on a regular basis and enjoys good health Rx for flexeril, cautioned regarding sedation. Ok to use other conservative measures and OTC meds as needed.  Asked to let us know if not improving soon.  However, if any significant SOB or other sx he is asked to seek care at the ER  Signed Lamar Blinks, MD

## 2021-07-28 ENCOUNTER — Telehealth: Payer: Self-pay | Admitting: Internal Medicine

## 2021-07-28 DIAGNOSIS — M62838 Other muscle spasm: Secondary | ICD-10-CM

## 2021-07-28 MED ORDER — CYCLOBENZAPRINE HCL 10 MG PO TABS: 10.0000 mg | ORAL_TABLET | Freq: Two times a day (BID) | ORAL | 0 refills | Status: DC | PRN

## 2021-07-28 NOTE — Telephone Encounter (Signed)
Rx resent.

## 2021-07-28 NOTE — Telephone Encounter (Signed)
Due to power outage his med was never received at [facility].  cyclobenzaprine (FLEXERIL) 10 MG tablet   [id number] [place] [address], [place] - [address]  [place], [place] [address]  Phone:  [phone]  Fax:  [phone]  DEA #:  [id number]

## 2021-08-01 ENCOUNTER — Telehealth: Payer: Self-pay | Admitting: Internal Medicine

## 2021-08-01 MED ORDER — HYDROCODONE-ACETAMINOPHEN 5-325 MG PO TABS: 1.0000 | ORAL_TABLET | Freq: Three times a day (TID) | ORAL | 0 refills | Status: AC | PRN

## 2021-08-01 NOTE — Telephone Encounter (Signed)
Please advise 

## 2021-08-01 NOTE — Telephone Encounter (Signed)
Patient stated he has had back spasm concerns since Wed 9.28. He had a vv appointment with [facility]  9.30. Medication was prescribed during vv. Patient was not able to start medication until 10.3. However pain is still occurring. Advised patient no appointments, to visit Urgent care/ED. Patient would like advice on what steps to take next. Please advise.

## 2021-08-01 NOTE — Telephone Encounter (Signed)
Pt scheduled  

## 2021-08-01 NOTE — Telephone Encounter (Signed)
Called pt back- he notes that flexeril is not helping him at all He did see his PT on Monday and they helped him some- however by the next day pain was back  He still feels like he has MSK pain - no CP or SOB I scheduled him to see me on Monday He will try to get a massage done Rx for vicodin sent in

## 2021-08-02 NOTE — Progress Notes (Signed)
[name] at [facility] [address] [place], [place] [address] [phone]  Date:  08/04/2021   Name:  OLUWADARASIMI REDMON   DOB:  [birth year]   MRN:  [id number]  PCP:  Colon Branch, MD    Chief Complaint: back spasms (Has been seen for this before. Has been Rx Flexeril and Norco. )   History of Present Illness:  Christian Mcgrath is a 65 y.o. very pleasant male patient who presents with the following:  Pt seen today for back spasm / pain  Last seen by myself for a virtual visit 9/30-  Today he notes about 3 days of back pain Today is  Friday.  Monday he did a long swim- the next day he had pain in his left upper back- seemed to be getting better but got worse after he carried his 54 yo grandson last night He is using advil but not helping Today he has used ice, voltaren gel, heat The area of pain is up high in the left trapezius area- he tried a massage gun and it did help a little    He is otherwise generally in good health He is still able to run, bike without any distress BP 120/80 approx in general   Mcgrath prescribed flexeril, but he called back a few days later with concern of continued pain which was keeping him awake at night.  Rx vicodin on 10/7  He notes that he is now about 50% better The upper back is much better He does have some soreness now in his left arm, and the index and long fingers are tingling  He has not noted any CP, jaw pain, sweating or SOB  He continues to be quite physically active, gets regular exercise with no symptoms of chest pain or shortness of breath  Of note, he does have abnormal EKG on his chart.  Patient notes he has seen cardiology for this several years in the past, most recently 2013.  Never had a stress test or other further cardiac evaluation except echo in 2013  Patient Active Problem List   Diagnosis Date Noted   PCP NOTES >>>> 09/27/2015   Hemorrhoids, Internal and external 01/29/2014    Abnormal ECG 08/04/2012   Annual physical exam 06/26/2011    Past Medical History:  Diagnosis Date   Abnormal ECG 08/04/2012   BCC (basal cell carcinoma of skin)    06-2010, sees derm   Colon polyps     Past Surgical History:  Procedure Laterality Date   COLONOSCOPY     HERNIA REPAIR     LEFT   KNEE ARTHROSCOPY     Left, age 16    Social History   Tobacco Use   Smoking status: Never   Smokeless tobacco: Never  Substance Use Topics   Alcohol use: Yes    Alcohol/week: 4.0 standard drinks    Types: 4 Cans of beer per week    Comment: 4 beers weekends   Drug use: No    Family History  Problem Relation Age of Onset   Prostate cancer Father        Dx age in early 43s   Cancer Father        oral, not a smoker    Coronary artery disease Neg Hx    Hypertension Neg Hx    Stroke Neg Hx    Diabetes Neg Hx    Colon cancer Neg Hx  Allergies  Allergen Reactions   Penicillins Other (See Comments)    "never had,Dad had a reaction"    Medication list has been reviewed and updated.  Current Outpatient Medications on File Prior to Visit  Medication Sig Dispense Refill   Azelastine-Fluticasone 137-50 MCG/ACT SUSP Place 1 spray into the nose every 12 (twelve) hours. 23 g 0   cyclobenzaprine (FLEXERIL) 10 MG tablet Take 1 tablet (10 mg total) by mouth 2 (two) times daily as needed for muscle spasms. 15 tablet 0   HYDROcodone-acetaminophen (NORCO/VICODIN) 5-325 MG tablet Take 1-2 tablets by mouth every 8 (eight) hours as needed for up to 5 days. 15 tablet 0   No current facility-administered medications on file prior to visit.    Review of Systems:  As per HPI- otherwise negative.   Physical Examination: Vitals:   08/04/21 1149  BP: 140/72  Pulse: (!) 55  Resp: 18  SpO2: 98%   Vitals:   08/04/21 1149  Weight: 187 lb 6.4 oz (85 kg)  Height: 5\' 9"  (1.753 m)   Body mass index is 27.67 kg/m. Ideal Body Weight: Weight in (lb) to have BMI = 25: 168.9  GEN:  no acute distress.  Looks well, normal weight with muscular build HEENT: Atraumatic, Normocephalic.  Ears and Nose: No external deformity. CV: RRR, No M/G/R. No JVD. No thrill. No extra heart sounds. PULM: CTA B, no wheezes, crackles, rhonchi. No retractions. No resp. distress. No accessory muscle use. EXTR: No c/c/e PSYCH: Normally interactive. Conversant.  Somewhat difficult to localize tenderness and pain over the left superior trapezius muscle.  Mcgrath am able to pinpoint tenderness at the medial border of the left scapula.  The patient also notes some difficulty with left wrist extension, flexion is normal.  Bilateral upper extremity strength is otherwise normal No redness or heat, no swelling or rash Normal range of motion of the left shoulder, no tenderness of the rotator cuff tendon insertion  EKG: sinus brady with non- specific ST depression.  Stable from several previous EKG.  However Mcgrath did touch base with cardiology due to abnormal EKG   Hey Dr. Lorelei Pont- this Dr. Gasper Sells from [facility]. Heard from my nurse that Mr. Christian Mcgrath has having some arm pain. He has a history of Brugada Type Syndrome first seen in 2013 after evaluation someone after one of his triathalon's. No hx of sudden cardiac death. A troponin is certainly very reasonable, but Mcgrath suspect his ECG is abnormal related to his Brugada syndrome. Assessment and Plan: Left arm pain - Plan: EKG 12-Lead, predniSONE (DELTASONE) 20 MG tablet  Acute pain of left shoulder - Plan: EKG 12-Lead  Abnormal EKG - Plan: Troponin Mcgrath (High Sensitivity), CANCELED: Troponin Mcgrath (High Sensitivity)  Patient seen today with continuation of left upper shoulder pain, now with some radiation down the left arm.  He has some difficulty and pain with extension of the left wrist, has noted tingling of the left index and long fingers.  Suspect he is having nerve entrapment at some level.  We will start him on prednisone, continue Flexeril or Vicodin as needed.   He will continue to see me posted about his progress  Given symptoms we did obtain EKG today.  It is abnormal but stable from previous.  Mcgrath have discussed this issue with cardiology and also will obtain a troponin  Signed Lamar Blinks, MD  Received troponin- message to pt  Results for orders placed or performed in visit on 08/04/21  Troponin Mcgrath (  High Sensitivity)  Result Value Ref Range   High Sens Troponin Mcgrath 6 2 - 17 ng/L

## 2021-08-04 ENCOUNTER — Telehealth: Payer: Self-pay | Admitting: Internal Medicine

## 2021-08-04 ENCOUNTER — Other Ambulatory Visit: Payer: Self-pay

## 2021-08-04 ENCOUNTER — Ambulatory Visit (INDEPENDENT_AMBULATORY_CARE_PROVIDER_SITE_OTHER): Payer: Medicare Other | Admitting: Family Medicine

## 2021-08-04 ENCOUNTER — Encounter: Payer: Self-pay | Admitting: Family Medicine

## 2021-08-04 VITALS — BP 140/72 | HR 55 | Resp 18 | Ht 69.0 in | Wt 187.4 lb

## 2021-08-04 DIAGNOSIS — M25512 Pain in left shoulder: Secondary | ICD-10-CM

## 2021-08-04 DIAGNOSIS — M79602 Pain in left arm: Secondary | ICD-10-CM | POA: Diagnosis not present

## 2021-08-04 DIAGNOSIS — R9431 Abnormal electrocardiogram [ECG] [EKG]: Secondary | ICD-10-CM | POA: Diagnosis not present

## 2021-08-04 LAB — TROPONIN I (HIGH SENSITIVITY): High Sens Troponin I: 6 ng/L (ref 2–17)

## 2021-08-04 MED ORDER — PREDNISONE 20 MG PO TABS: ORAL_TABLET | ORAL | 0 refills | Status: DC

## 2021-08-04 NOTE — Patient Instructions (Signed)
Good to see you again- I will be in touch with your troponin and will run your EKG by Dr Johnsie Cancel to make sure no concerns  Please let me know if your symptoms do not continue to get better or if the numbness and tingling in your left arm does not go away

## 2021-08-05 NOTE — Telephone Encounter (Signed)
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  EKG showed similar changes to prior.  Fam medicine got troponin, reportedly negative.  Asked for to arrange EP follow up (formerly [place]) non-urgent.   Thanks,  MAC    EP referral placed.

## 2021-08-11 NOTE — Addendum Note (Signed)
Addended by: Lamar Blinks C on: 08/11/2021 03:16 PM   Modules accepted: Orders

## 2021-08-11 NOTE — Telephone Encounter (Signed)
Pt called regarding suggestions on pain in his elbow. Please advise.

## 2021-08-12 ENCOUNTER — Other Ambulatory Visit: Payer: Self-pay

## 2021-08-12 ENCOUNTER — Ambulatory Visit (HOSPITAL_BASED_OUTPATIENT_CLINIC_OR_DEPARTMENT_OTHER)
Admission: RE | Admit: 2021-08-12 | Discharge: 2021-08-12 | Disposition: A | Discharge status: Home / Self Care | Payer: Medicare Other | Source: Ambulatory Visit | Attending: Family Medicine | Admitting: Family Medicine

## 2021-08-12 DIAGNOSIS — M79602 Pain in left arm: Secondary | ICD-10-CM | POA: Diagnosis not present

## 2021-08-13 ENCOUNTER — Encounter: Payer: Self-pay | Admitting: Family Medicine

## 2021-08-13 ENCOUNTER — Ambulatory Visit (INDEPENDENT_AMBULATORY_CARE_PROVIDER_SITE_OTHER): Payer: Medicare Other | Admitting: Family Medicine

## 2021-08-13 VITALS — BP 140/90 | Ht 69.0 in | Wt 187.0 lb

## 2021-08-13 DIAGNOSIS — M5412 Radiculopathy, cervical region: Secondary | ICD-10-CM | POA: Diagnosis not present

## 2021-08-13 MED ORDER — HYDROCODONE-ACETAMINOPHEN 5-325 MG PO TABS: 1.0000 | ORAL_TABLET | Freq: Three times a day (TID) | ORAL | 0 refills | Status: DC | PRN

## 2021-08-13 MED ORDER — KETOROLAC TROMETHAMINE 30 MG/ML IJ SOLN
30.0000 mg | Freq: Once | INTRAMUSCULAR | Status: AC
  Administered 2021-08-13: 30 mg via INTRAMUSCULAR

## 2021-08-13 MED ORDER — MELOXICAM 7.5 MG PO TABS: 7.5000 mg | ORAL_TABLET | Freq: Two times a day (BID) | ORAL | 1 refills | Status: DC | PRN

## 2021-08-13 NOTE — Telephone Encounter (Signed)
Pt scheduled w/ Dr. Raeford Razor today at 1:10pm.

## 2021-08-13 NOTE — Progress Notes (Signed)
  Christian Mcgrath - 65 y.o. male MRN [id number]  Date of birth: 1956/04/27  SUBJECTIVE:  Including CC & ROS.  No chief complaint on file.   Christian Mcgrath is a 65 y.o. male that is presenting with acute left shoulder and arm pain.  Pain has been ongoing for a few weeks.  Has tried steroids with limited improvement.  Denies any specific injury or inciting event..  Independent review of the cervical spine x-ray from 10/18 shows diffuse degenerative changes and osteopenia.    Review of Systems See HPI   HISTORY: Past Medical, Surgical, Social, and Family History Reviewed & Updated per EMR.   Pertinent Historical Findings include:  Past Medical History:  Diagnosis Date  . Abnormal ECG 08/04/2012  . BCC (basal cell carcinoma of skin)    06-2010, sees derm  . Colon polyps     Past Surgical History:  Procedure Laterality Date  . COLONOSCOPY    . HERNIA REPAIR     LEFT  . KNEE ARTHROSCOPY     Left, age 40    Family History  Problem Relation Age of Onset  . Prostate cancer Father        Dx age in early 30s  . Cancer Father        oral, not a smoker   . Coronary artery disease Neg Hx   . Hypertension Neg Hx   . Stroke Neg Hx   . Diabetes Neg Hx   . Colon cancer Neg Hx     Social History   Socioeconomic History  . Marital status: Married    Spouse name: Not on file  . Number of children: 3  . Years of education: Not on file  . Highest education level: Not on file  Occupational History  . 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 Smoking status: Never  . Smokeless tobacco: Never  Substance and Sexual Activity  . Alcohol use: Yes    Alcohol/week: 4.0 standard drinks    Types: 4 Cans of beer per week    Comment: 4 beers weekends  . Drug use: No  . Sexual activity: Not on file  Other Topics Concern  . Not on file  Social History Narrative   Mother 78 y/o, Father 39y/o             Social Determinants of Adult nurse Strain: Not on file  Food Insecurity: Not on file  Transportation Needs: Not on file  Physical Activity: Not on file  Stress: Not on file  Social Connections: Not on file  Intimate Partner Violence: Not on file     PHYSICAL EXAM:  VS: BP 140/90 (BP Location: Left Arm, Patient Position: Sitting)   Ht 5\' 9"  (1.753 m)   Wt 187 lb (84.8 kg)   BMI 27.62 kg/m  Physical Exam Gen: NAD, alert, cooperative with exam, well-appearing      ASSESSMENT & PLAN:   Cervical radiculopathy Symptoms most consistent with radicular pain with degenerative change in the cervical spine. -Counseled on home exercise therapy and supportive care. -IM Toradol. -Meloxicam. -Norco. -Could consider physical therapy or gabapentin.

## 2021-08-13 NOTE — Patient Instructions (Signed)
Nice to meet you Please continue heat  Please use the norco for severe pain.  Please use the mobic for mild to moderate   Please send me a message in MyChart with any questions or updates.  Please see me back in 1-2 weeks.   --Dr. Raeford Razor

## 2021-08-13 NOTE — Assessment & Plan Note (Signed)
Symptoms most consistent with radicular pain with degenerative change in the cervical spine. -Counseled on home exercise therapy and supportive care. -IM Toradol. -Meloxicam. -Norco. -Could consider physical therapy or gabapentin.

## 2021-08-27 ENCOUNTER — Ambulatory Visit: Payer: Self-pay

## 2021-08-27 ENCOUNTER — Encounter: Payer: Self-pay | Admitting: Family Medicine

## 2021-08-27 ENCOUNTER — Ambulatory Visit (INDEPENDENT_AMBULATORY_CARE_PROVIDER_SITE_OTHER): Payer: Medicare Other | Admitting: Family Medicine

## 2021-08-27 VITALS — BP 140/70 | Ht 69.0 in | Wt 187.0 lb

## 2021-08-27 DIAGNOSIS — G5622 Lesion of ulnar nerve, left upper limb: Secondary | ICD-10-CM

## 2021-08-27 MED ORDER — TRIAMCINOLONE ACETONIDE 40 MG/ML IJ SUSP
40.0000 mg | Freq: Once | INTRAMUSCULAR | Status: AC
  Administered 2021-08-27: 40 mg via INTRA_ARTICULAR

## 2021-08-27 NOTE — Assessment & Plan Note (Signed)
Appears to have more of a ulnar nerve problem at this time given the findings on ultrasound -Counseled on home exercise therapy and supportive care. -Injection today. - Could consider gabapentin

## 2021-08-27 NOTE — Progress Notes (Signed)
Christian Mcgrath - 65 y.o. male MRN [id number]  Date of birth: [birth year]  SUBJECTIVE:  Including CC & ROS.  No chief complaint on file.   Christian Mcgrath is a 65 y.o. male that is presenting with acute worsening of the left arm pain.  Appears to be more isolated to the elbow and radiates distally.  Did not do well over the past week.  Having to use ice on the area.    Review of Systems See HPI   HISTORY: Past Medical, Surgical, Social, and Family History Reviewed & Updated per EMR.   Pertinent Historical Findings include:  Past Medical History:  Diagnosis Date   Abnormal ECG 08/04/2012   BCC (basal cell carcinoma of skin)    06-2010, sees derm   Colon polyps     Past Surgical History:  Procedure Laterality Date   COLONOSCOPY     HERNIA REPAIR     LEFT   KNEE ARTHROSCOPY     Left, age 88    Family History  Problem Relation Age of Onset   Prostate cancer Father        Dx age in early 83s   Cancer Father        oral, not a smoker    Coronary artery disease Neg Hx    Hypertension Neg Hx    Stroke Neg Hx    Diabetes Neg Hx    Colon cancer Neg Hx     Social History   Socioeconomic History   Marital status: Married    Spouse name: Not on file   Number of children: 3   Years of education: Not on file   Highest education level: Not on file  Occupational History   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    Employer: [employer]  Tobacco Use   Smoking status: Never   Smokeless tobacco: Never  Substance and Sexual Activity   Alcohol use: Yes    Alcohol/week: 4.0 standard drinks    Types: 4 Cans of beer per week    Comment: 4 beers weekends   Drug use: No   Sexual activity: Not on file  Other Topics Concern   Not on file  Social History Narrative   Mother 74 y/o, Father 24y/o             Social Determinants of Radio broadcast assistant Strain: Not on file  Food Insecurity: Not on file  Transportation Needs: Not on file  Physical Activity: Not on file   Stress: Not on file  Social Connections: Not on file  Intimate Partner Violence: Not on file     PHYSICAL EXAM:  VS: BP 140/70 (BP Location: Left Arm, Patient Position: Sitting)   Ht 5\' 9"  (1.753 m)   Wt 187 lb (84.8 kg)   BMI 27.62 kg/m  Physical Exam Gen: NAD, alert, cooperative with exam, well-appearing   Limited ultrasound: Left elbow:  There appears to be thickening and swelling of the ulnar nerve as it courses in the cubital tunnel as opposed to the proximal portion  Summary: Ulnar neuropathy  Ultrasound and interpretation by Clearance Coots, MD   Aspiration/Injection Procedure Note [name] 05-17-1956  Procedure: Injection Indications: Left elbow pain  Procedure Details Consent: Risks of procedure as well as the alternatives and risks of each were explained to the (patient/caregiver).  Consent for procedure obtained. Time Out: Verified patient identification, verified procedure, site/side was marked, verified correct patient position, special equipment/implants available, medications/allergies/relevent history reviewed,  required imaging and test results available.  Performed.  The area was cleaned with iodine and alcohol swabs.    The left ulnar nerve in the cubital tunnel was injected using 1cc 1% lidocaine on a 25-gauge 1-1/2 inch needle.  The syringe was switched to mixture containing 1 cc's of 40 mg Depo-Medrol and 1 cc's of 0.25% bupivacaine was injected.  Ultrasound was used. Images were obtained in short views showing the injection.     A sterile dressing was applied.  Patient did tolerate procedure well.    ASSESSMENT & PLAN:   Ulnar neuropathy of left upper extremity Appears to have more of a ulnar nerve problem at this time given the findings on ultrasound -Counseled on home exercise therapy and supportive care. -Injection today. - Could consider gabapentin

## 2021-08-27 NOTE — Patient Instructions (Signed)
Good to see you Please try the exercises   Please send me a message in MyChart with any questions or updates.  Please see me back in 2-3 weeks.   --Dr. Raeford Razor

## 2021-09-12 ENCOUNTER — Ambulatory Visit (INDEPENDENT_AMBULATORY_CARE_PROVIDER_SITE_OTHER): Payer: Medicare Other | Admitting: Family Medicine

## 2021-09-12 ENCOUNTER — Encounter: Payer: Self-pay | Admitting: Family Medicine

## 2021-09-12 DIAGNOSIS — G5622 Lesion of ulnar nerve, left upper limb: Secondary | ICD-10-CM

## 2021-09-12 NOTE — Assessment & Plan Note (Signed)
Significant improvement with the injection of cubital tunnel. Having remnants of pain down around the [place]. -Counseled on home exercise therapy for care. -Could consider injection going forward.

## 2021-09-12 NOTE — Progress Notes (Signed)
  Christian Mcgrath - 65 y.o. male MRN [id number]  Date of birth: [birth year]  SUBJECTIVE:  Including CC & ROS.  No chief complaint on file.   Christian Mcgrath is a 65 y.o. male that is  following up for his left arm pain. Has gotten improvement with the elbow injection. Still having minor pain at the wrist.   Review of Systems See HPI   HISTORY: Past Medical, Surgical, Social, and Family History Reviewed & Updated per EMR.   Pertinent Historical Findings include:  Past Medical History:  Diagnosis Date   Abnormal ECG 08/04/2012   BCC (basal cell carcinoma of skin)    06-2010, sees derm   Colon polyps     Past Surgical History:  Procedure Laterality Date   COLONOSCOPY     HERNIA REPAIR     LEFT   KNEE ARTHROSCOPY     Left, age 31    Family History  Problem Relation Age of Onset   Prostate cancer Father        Dx age in early 17s   Cancer Father        oral, not a smoker    Coronary artery disease Neg Hx    Hypertension Neg Hx    Stroke Neg Hx    Diabetes Neg Hx    Colon cancer Neg Hx     Social History   Socioeconomic History   Marital status: Married    Spouse name: Not on file   Number of children: 3   Years of education: Not on file   Highest education level: Not on file  Occupational History   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    Employer: [employer]  Tobacco Use   Smoking status: Never   Smokeless tobacco: Never  Substance and Sexual Activity   Alcohol use: Yes    Alcohol/week: 4.0 standard drinks    Types: 4 Cans of beer per week    Comment: 4 beers weekends   Drug use: No   Sexual activity: Not on file  Other Topics Concern   Not on file  Social History Narrative   Mother 62 y/o, Father 15y/o             Social Determinants of Health   Financial Resource Strain: Not on file  Food Insecurity: Not on file  Transportation Needs: Not on file  Physical Activity: Not on file  Stress: Not on file  Social Connections: Not on file  Intimate  Partner Violence: Not on file     PHYSICAL EXAM:  VS: BP 100/72 (BP Location: Left Arm, Patient Position: Sitting)   Ht 5\' 9"  (1.753 m)   Wt 187 lb (84.8 kg)   BMI 27.62 kg/m  Physical Exam Gen: NAD, alert, cooperative with exam, well-appearing    ASSESSMENT & PLAN:   Ulnar neuropathy of left upper extremity Significant improvement with the injection of cubital tunnel. Having remnants of pain down around the [place]. -Counseled on home exercise therapy for care. -Could consider injection going forward.

## 2021-09-16 ENCOUNTER — Ambulatory Visit: Payer: Medicare Other | Admitting: Family Medicine

## 2021-09-24 NOTE — Progress Notes (Deleted)
Did you still Christian Mcgrath's note     ELECTROPHYSIOLOGY CONSULT NOTE  Patient ID: Christian Mcgrath, MRN: [id number], DOB/AGE: 06/10/1956 65 y.o. Admit date: (Not on file) Date of Consult: 09/24/2021  Primary Physician: Christian Branch, MD Primary Cardiologist: ***     Christian Mcgrath is a 65 y.o. male who is being seen today for the evaluation of *** at the request of ***.    HPI Christian Mcgrath is a 65 y.o. male ***   Previously seen 10/13 for abnormal ECG-  Brugada type 2.  In the absence of symptoms, nothing was to be done, except temperature and some drug avoidance, ( Brugadadrugs.org)   With R' and RAD, echo done to exclude ASD DATE TEST EF   10/13 Echo  (bubble study)  55-60 % No ASD by bubble             Date Cr K Hgb  ***/*** *** *** ***  ***/*** *** *** ***      Past Medical History:  Diagnosis Date   Abnormal ECG 08/04/2012   BCC (basal cell carcinoma of skin)    06-2010, sees derm   Christian polyps       Surgical History:  Past Surgical History:  Procedure Laterality Date   COLONOSCOPY     HERNIA REPAIR     LEFT   KNEE ARTHROSCOPY     Left, age 69     Home Meds: No outpatient medications have been marked as taking for the 09/25/21 encounter (Appointment) with Christian Sprang, MD.    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    Employer: [employer]  Tobacco Use   Smoking status: Never   Smokeless tobacco: Never  Substance and Sexual Activity   Alcohol use: Yes    Alcohol/week: 4.0 standard drinks    Types: 4 Cans of beer per week    Comment: 4 beers weekends   Drug use: No   Sexual activity: Not on file  Other  Topics Concern   Not on file  Social History Narrative   Mother 7 y/o, Father 67y/o             Social Determinants of Health   Financial Resource Strain: Not on file  Food Insecurity: Not on file  Transportation Needs: Not on file  Physical Activity: Not on file  Stress: Not on file  Social Connections: Not on file  Intimate Partner Violence: Not on file     Family History  Problem Relation Age of Onset   Prostate cancer Father        Dx age in early 56s   Cancer Father        oral, not a smoker    Coronary artery disease Neg Hx    Hypertension Neg Hx    Stroke Neg Hx    Diabetes Neg Hx    Christian cancer Neg Hx      ROS:  Please see the history of present illness.   {ros [id number]}  All other systems reviewed and negative.    Physical Exam:*** There were  no vitals taken for this visit. General: Well developed, well nourished male in no acute distress. Head: Normocephalic, atraumatic, sclera non-icteric, no xanthomas, nares are without discharge. EENT: normal  Lymph Nodes:  none Neck: Negative for carotid bruits. JVD not elevated. Back:without scoliosis kyphosis*** Lungs: Clear bilaterally to auscultation without wheezes, rales, or rhonchi. Breathing is unlabored. Heart: RRR with S1 S2. No *** ***/6 systolic*** murmur . No rubs, or gallops appreciated. Abdomen: Soft, non-tender, non-distended with normoactive bowel sounds. No hepatomegaly. No rebound/guarding. No obvious abdominal masses. Msk:  Strength and tone appear normal for age. Extremities: No clubbing or cyanosis. No*** ***+*** edema.  Distal pedal pulses are 2+ and equal bilaterally. Skin: Warm and Dry Neuro: Alert and oriented X 3. CN III-XII intact Grossly normal sensory and motor function . Psych:  Responds to questions appropriately with a normal affect.      Labs: Cardiac Enzymes No results for input(s): CKTOTAL, CKMB, TROPONINI in the last 72 hours. CBC Lab Results  Component Value Date    WBC 7.8 05/28/2021   HGB 16.0 05/28/2021   HCT 48.0 05/28/2021   MCV 93.7 05/28/2021   PLT 199.0 05/28/2021   PROTIME: No results for input(s): LABPROT, INR in the last 72 hours. Chemistry No results for input(s): NA, K, CL, CO2, BUN, CREATININE, CALCIUM, PROT, BILITOT, ALKPHOS, ALT, AST, GLUCOSE in the last 168 hours.  Invalid input(s): LABALBU Lipids Lab Results  Component Value Date   CHOL 213 (H) 05/28/2021   HDL 55.60 05/28/2021   LDLCALC 143 (H) 05/28/2021   TRIG 72.0 05/28/2021   BNP No results found for: PROBNP Thyroid Function Tests: No results for input(s): TSH, T4TOTAL, T3FREE, THYROIDAB in the last 72 hours.  Invalid input(s): FREET3 Miscellaneous No results found for: DDIMER  Radiology/Studies:  [place] LIMITED JOINT SPACE STRUCTURES UP LEFT  Result Date: 09/04/2021 Limited ultrasound: Left elbow:   There appears to be thickening and swelling of the ulnar nerve as it courses in the cubital tunnel as opposed to the proximal portion   Summary: Ulnar neuropathy   Ultrasound and interpretation by Clearance Coots, MD     Aspiration/Injection Procedure Note [name] Feb 12, 1956   Procedure: Injection Indications: Left elbow pain   Procedure Details Consent: Risks of procedure as well as the alternatives and risks of each were explained to the (patient/caregiver).  Consent for procedure obtained. Time Out: Verified patient identification, verified procedure, site/side was marked, verified correct patient position, special equipment/implants available, medications/allergies/relevent history reviewed, required imaging and test results available.  Performed.  The area was cleaned with iodine and alcohol swabs.    The left ulnar nerve in the cubital tunnel was injected using 1cc 1% lidocaine on a 25-gauge 1-1/2 inch needle.  The syringe was switched to mixture containing 1 cc's of 40 mg Depo-Medrol and 1 cc's of 0.25% bupivacaine was injected.  Ultrasound was used. Images were  obtained in short views showing the injection.      A sterile dressing was applied.   Patient did tolerate procedure well.   EKG: ***   Assessment and Plan: *** [name]

## 2021-09-25 ENCOUNTER — Other Ambulatory Visit: Payer: Self-pay

## 2021-09-25 ENCOUNTER — Encounter: Payer: Self-pay | Admitting: Internal Medicine

## 2021-09-25 ENCOUNTER — Ambulatory Visit (INDEPENDENT_AMBULATORY_CARE_PROVIDER_SITE_OTHER): Payer: Medicare Other | Admitting: Internal Medicine

## 2021-09-25 VITALS — BP 110/88 | HR 58 | Ht 69.0 in | Wt 185.4 lb

## 2021-09-25 DIAGNOSIS — G5622 Lesion of ulnar nerve, left upper limb: Secondary | ICD-10-CM

## 2021-09-25 DIAGNOSIS — M5412 Radiculopathy, cervical region: Secondary | ICD-10-CM | POA: Diagnosis not present

## 2021-09-25 DIAGNOSIS — R9431 Abnormal electrocardiogram [ECG] [EKG]: Secondary | ICD-10-CM | POA: Diagnosis not present

## 2021-09-25 NOTE — Progress Notes (Signed)
ELECTROPHYSIOLOGY CONSULT NOTE  Patient ID: Christian Mcgrath, MRN: [id number], DOB/AGE: 1956-02-25 65 y.o. Admit date: (Not on file) Date of Consult: 09/25/2021  Primary Physician: Colon Branch, MD Primary Cardiologist:       Christian Mcgrath is a 65 y.o. male who is being seen today for the evaluation of abnormal EKG  at the request of Dr. Larose Kells. Marland Kitchen    HPI Christian Mcgrath is a 65 y.o. male with history of Brugada syndrome Type 2.  He saw Dr. Lorelei Pont who noted the abnormal EKG history. He reported seeing cardiology in the past but never underwent a stress test or further cardiac evaluation. He was referred to EP.   Previously seen 10/13 for abnormal ECG-  Brugada type 2.  In the absence of symptoms, nothing was to be done, except temperature and some drug avoidance, ( Brugadadrugs.org)   With R' and RAD, echo done to exclude ASD  DATE TEST EF   10/13 Echo  (bubble study)  55-60 % No ASD by bubble   Date Cr K Hgb  7/21 0.99 4.6 14.9  8/22 1.24 5.2 16.0   A few months ago, he was playing golf and, after he went home, his back tensed up. He experienced L shoulder pain he describes as if someone stabbed him in the shoulder. He later experienced localized nerve pain in his L elbow he describes as if there was a "nail in his elbow". He receives Cortisone shots for his elbow. Reportedly, it was determined his ulnar nerve is pinched. He stays active by running but limits activities involving lifting and biking. His father and mother are 63 and 2 years old respectively and still alive with no known cardiovascular problems. The patient denies chest pain, shortness of breath, nocturnal dyspnea, orthopnea or peripheral edema.  There have been no palpitations, lightheadedness or syncope. Complains of LUE pain (arm and elbow), back pain.  Past Medical History:  Diagnosis Date   Abnormal ECG 08/04/2012   BCC (basal cell carcinoma of skin)    06-2010, sees derm   Colon polyps       Surgical  History:  Past Surgical History:  Procedure Laterality Date   COLONOSCOPY     HERNIA REPAIR     LEFT   KNEE ARTHROSCOPY     Left, age 15     Home Meds: Current Meds  Medication Sig   Azelastine-Fluticasone 137-50 MCG/ACT SUSP Place 1 spray into the nose every 12 (twelve) hours.    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    Employer: [employer]  Tobacco Use   Smoking status: Never   Smokeless tobacco: Never  Substance and Sexual Activity   Alcohol use: Yes    Alcohol/week: 4.0 standard drinks    Types: 4 Cans of beer per week    Comment: 4 beers weekends   Drug use: No   Sexual activity: Not on file  Other Topics Concern   Not on file  Social History Narrative   Mother 74 y/o, Father 46y/o  Social Determinants of Health   Financial Resource Strain: Not on file  Food Insecurity: Not on file  Transportation Needs: Not on file  Physical Activity: Not on file  Stress: Not on file  Social Connections: Not on file  Intimate Partner Violence: Not on file     Family History  Problem Relation Age of Onset   Prostate cancer Father        Dx age in early 74s   Cancer Father        oral, not a smoker    Coronary artery disease Neg Hx    Hypertension Neg Hx    Stroke Neg Hx    Diabetes Neg Hx    Colon cancer Neg Hx      ROS:  Please see the history of present illness.   All other systems reviewed and negative.    Physical Exam: Vitals:   09/25/21 1435  BP: 110/88  Pulse: (!) 58  SpO2: 98%  Alert and oriented in no acute distress HENT- normal Eyes- EOMI, without scleral icterus Skin- warm and dry; without  rashes LN-neg Neck- supple without thyromegaly, JVP-flat, carotids brisk and full without bruits Back-without CVAT or kyphosis Lungs-clear to auscultation CV-Regular rate and rhythm, nl S1 and S2, no murmurs gallops or rubs, S4-absent Abd-soft with active bowel sounds; no midline pulsation or hepatomegaly Pulses-intact femoral and distal MKS-without gross deformity Neuro- Ax O, CN3-12 intact, grossly normal motor and sensory function Affect engaging   ECG sinus @ 58 13/10/42 ST elevation V1-2       Labs: Cardiac Enzymes No results for input(s): CKTOTAL, CKMB, TROPONINI in the last 72 hours. CBC Lab Results  Component Value Date   WBC 7.8 05/28/2021   HGB 16.0 05/28/2021   HCT 48.0 05/28/2021   MCV 93.7 05/28/2021   PLT 199.0 05/28/2021   PROTIME: No results for input(s): LABPROT, INR in the last 72 hours. Chemistry No results for input(s): NA, K, CL, CO2, BUN, CREATININE, CALCIUM, PROT, BILITOT, ALKPHOS, ALT, AST, GLUCOSE in the last 168 hours.  Invalid input(s): LABALBU Lipids Lab Results  Component Value Date   CHOL 213 (H) 05/28/2021   HDL 55.60 05/28/2021   LDLCALC 143 (H) 05/28/2021   TRIG 72.0 05/28/2021   BNP No results found for: PROBNP Thyroid Function Tests: No results for input(s): TSH, T4TOTAL, T3FREE, THYROIDAB in the last 72 hours.  Invalid input(s): FREET3 Miscellaneous No results found for: DDIMER  Radiology/Studies:  [place] LIMITED JOINT SPACE STRUCTURES UP LEFT  Result Date: 09/04/2021 Limited ultrasound: Left elbow:   There appears to be thickening and swelling of the ulnar nerve as it courses in the cubital tunnel as opposed to the proximal portion   Summary: Ulnar neuropathy   Ultrasound and interpretation by Clearance Coots, MD     Aspiration/Injection Procedure Note [name] 02-May-1956   Procedure: Injection Indications: Left elbow pain   Procedure Details Consent: Risks of procedure as well as the alternatives and risks of each were  explained to the (patient/caregiver).  Consent for procedure obtained. Time Out: Verified patient identification, verified procedure, site/side was marked, verified correct patient position, special equipment/implants available, medications/allergies/relevent history reviewed, required imaging and test results available.  Performed.  The area was cleaned with iodine and alcohol swabs.    The left ulnar nerve in the cubital tunnel was injected using 1cc 1% lidocaine on a 25-gauge 1-1/2 inch needle.  The syringe was switched to mixture containing 1 cc's of 40 mg Depo-Medrol and  1 cc's of 0.25% bupivacaine was injected.  Ultrasound was used. Images were obtained in short views showing the injection.      A sterile dressing was applied.   Patient did tolerate procedure well.    Assessment and Plan:  Abnormal ECG  Left Arm pain   The patient's electrocardiogram remains abnormal consistent with a type II Brugada pattern; in the absence of symptoms he is advised to be aggressive about antipyretics.  His left arm pain suggests ulnar nerve issues or perhaps cervical spine.  I reached out to Dr. Saintclair Halsted to see whether he thinks consultation would be in order.   I,Mykaella Javier,acting as a scribe for [name], MD.,have documented all relevant documentation on the behalf of [name], MD,as directed by  [name], MD while in the presence of [name], MD.  I, [name], MD, have reviewed all documentation for this visit. The documentation on 09/25/21 for the exam, diagnosis, procedures, and orders are all accurate and complete.   Mykaella Garlon Hatchet

## 2021-09-25 NOTE — Patient Instructions (Signed)
Medication Instructions:  Your physician recommends that you continue on your current medications as directed. Please refer to the Current Medication list given to you today.  *If you need a refill on your cardiac medications before your next appointment, please call your pharmacy*   Lab Work: None ordered.  If you have labs (blood work) drawn today and your tests are completely normal, you will receive your results only by: [place] (if you have MyChart) OR A paper copy in the mail If you have any lab test that is abnormal or we need to change your treatment, we will call you to review the results.   Testing/Procedures: None ordered.    Follow-Up: At [facility], you and your health needs are our priority.  As part of our continuing mission to provide you with exceptional heart care, we have created designated Provider Care Teams.  These Care Teams include your primary Cardiologist (physician) and Advanced Practice Providers (APPs -  Physician Assistants and Nurse Practitioners) who all work together to provide you with the care you need, when you need it.  We recommend signing up for the patient portal called "MyChart".  Sign up information is provided on this After Visit Summary.  MyChart is used to connect with patients for Virtual Visits (Telemedicine).  Patients are able to view lab/test results, encounter notes, upcoming appointments, etc.  Non-urgent messages can be sent to your provider as well.   To learn more about what you can do with MyChart, go to [email or web address].    Your next appointment:   Follow up as needed with Dr Caryl Comes

## 2021-09-29 NOTE — Addendum Note (Signed)
Addended by: Thora Lance on: 09/29/2021 12:26 PM   Modules accepted: Orders

## 2021-12-11 DIAGNOSIS — C44310 Basal cell carcinoma of skin of unspecified parts of face: Secondary | ICD-10-CM | POA: Insufficient documentation

## 2021-12-11 DIAGNOSIS — C44519 Basal cell carcinoma of skin of other part of trunk: Secondary | ICD-10-CM | POA: Insufficient documentation

## 2022-04-30 ENCOUNTER — Encounter: Payer: Self-pay | Admitting: Internal Medicine

## 2022-06-01 ENCOUNTER — Ambulatory Visit (INDEPENDENT_AMBULATORY_CARE_PROVIDER_SITE_OTHER): Payer: Medicare Other | Admitting: Internal Medicine

## 2022-06-01 ENCOUNTER — Encounter: Payer: Self-pay | Admitting: Internal Medicine

## 2022-06-01 VITALS — BP 128/72 | HR 55 | Temp 98.1°F | Resp 16 | Ht 69.0 in | Wt 190.2 lb

## 2022-06-01 DIAGNOSIS — Z125 Encounter for screening for malignant neoplasm of prostate: Secondary | ICD-10-CM | POA: Diagnosis not present

## 2022-06-01 DIAGNOSIS — E785 Hyperlipidemia, unspecified: Secondary | ICD-10-CM

## 2022-06-01 DIAGNOSIS — R9431 Abnormal electrocardiogram [ECG] [EKG]: Secondary | ICD-10-CM

## 2022-06-01 DIAGNOSIS — I498 Other specified cardiac arrhythmias: Secondary | ICD-10-CM

## 2022-06-01 DIAGNOSIS — C44310 Basal cell carcinoma of skin of unspecified parts of face: Secondary | ICD-10-CM

## 2022-06-01 DIAGNOSIS — Z8042 Family history of malignant neoplasm of prostate: Secondary | ICD-10-CM | POA: Diagnosis not present

## 2022-06-01 DIAGNOSIS — G629 Polyneuropathy, unspecified: Secondary | ICD-10-CM | POA: Diagnosis not present

## 2022-06-01 DIAGNOSIS — Z1211 Encounter for screening for malignant neoplasm of colon: Secondary | ICD-10-CM

## 2022-06-01 LAB — CBC WITH DIFFERENTIAL/PLATELET
Basophils Absolute: 0 10*3/uL (ref 0.0–0.1)
Basophils Relative: 0.9 % (ref 0.0–3.0)
Eosinophils Absolute: 0.2 10*3/uL (ref 0.0–0.7)
Eosinophils Relative: 5.2 % — ABNORMAL HIGH (ref 0.0–5.0)
HCT: 43.5 % (ref 39.0–52.0)
Hemoglobin: 14.7 g/dL (ref 13.0–17.0)
Lymphocytes Relative: 47.2 % — ABNORMAL HIGH (ref 12.0–46.0)
Lymphs Abs: 2 10*3/uL (ref 0.7–4.0)
MCHC: 33.8 g/dL (ref 30.0–36.0)
MCV: 91.5 fl (ref 78.0–100.0)
Monocytes Absolute: 0.4 10*3/uL (ref 0.1–1.0)
Monocytes Relative: 9.9 % (ref 3.0–12.0)
Neutro Abs: 1.6 10*3/uL (ref 1.4–7.7)
Neutrophils Relative %: 36.8 % — ABNORMAL LOW (ref 43.0–77.0)
Platelets: 179 10*3/uL (ref 150.0–400.0)
RBC: 4.76 Mil/uL (ref 4.22–5.81)
RDW: 14.2 % (ref 11.5–15.5)
WBC: 4.3 10*3/uL (ref 4.0–10.5)

## 2022-06-01 LAB — B12 AND FOLATE PANEL
Folate: 14.2 ng/mL (ref >5.9)
Vitamin B-12: 339 pg/mL (ref 211–911)

## 2022-06-01 LAB — TSH: TSH: 2.82 u[IU]/mL (ref 0.35–5.50)

## 2022-06-01 LAB — HEMOGLOBIN A1C: Hgb A1c MFr Bld: 5.4 % (ref 4.6–6.5)

## 2022-06-01 LAB — PSA: PSA: 1.59 ng/mL (ref 0.10–4.00)

## 2022-06-01 NOTE — Progress Notes (Unsigned)
Subjective:    Patient ID: Christian Mcgrath, male    DOB: [birth year], 66 y.o.   MRN: [id number]  DOS:  06/01/2022 Type of visit - description: Routine checkup  Last year, developed left shoulder pain. Was seen here, then the sports medicine and eventually neurosurgery. Workup was normal.  He feels better.  We reviewed his chronic medical problems  Denies fever or chills No chest pain or difficulty breathing.  No palpitations No nausea or vomiting.  No blood in the stools. No dysuria or gross hematuria  Review of Systems See above   Past Medical History:  Diagnosis Date   Abnormal ECG 08/04/2012   BCC (basal cell carcinoma of skin)    06-2010, sees derm   Colon polyps     Past Surgical History:  Procedure Laterality Date   COLONOSCOPY     HERNIA REPAIR     LEFT   KNEE ARTHROSCOPY     Left, age 55    Current Outpatient Medications  Medication Instructions   Azelastine-Fluticasone 137-50 MCG/ACT SUSP 1 spray, Nasal, Every 12 hours   cyclobenzaprine (FLEXERIL) 10 mg, Oral, 2 times daily PRN   HYDROcodone-acetaminophen (NORCO/VICODIN) 5-325 MG tablet 1 tablet, Oral, Every 8 hours PRN   meloxicam (MOBIC) 7.5 mg, Oral, 2 times daily PRN   predniSONE (DELTASONE) 20 MG tablet Take 40 mg daily for 4 days, then 20 mg daily for 4 days       Objective:   Physical Exam BP 128/72   Pulse (!) 55   Temp 98.1 F (36.7 C) (Oral)   Resp 16   Ht '5\' 9"'$  (1.753 m)   Wt 190 lb 4 oz (86.3 kg)   SpO2 97%   BMI 28.10 kg/m  General: Well developed, NAD, BMI noted Neck: No  thyromegaly  HEENT:  Normocephalic . Face symmetric, atraumatic Lungs:  CTA B Normal respiratory effort, no intercostal retractions, no accessory muscle use. Heart: RRR,  no murmur.  Abdomen:  Not distended, soft, non-tender. No rebound or rigidity.   Lower extremities: no pretibial edema bilaterally  Skin: Exposed areas without rash. Not pale. Not jaundice DRE: Normal sphincter tone, brown stools,  prostate normal Neurologic:  alert & oriented X3.  Speech normal, gait appropriate for age and unassisted Strength symmetric and appropriate for age.  Psych: Cognition and judgment appear intact.  Cooperative with normal attention span and concentration.  Behavior appropriate. No anxious or depressed appearing.     Assessment   Assessment : [facility] 2011 sees dermatology Abnormal EKG 2013: Saw Dr. Caryl Comes 2013, ? Brugadae, type II pattern. No symptoms, no further eval indicated , echo normal Neuropathy  PLAN: Obtain office visit: History of BCC: Sees dermatology regularly  Brugada syndrome: Type II, asymptomatic, saw cardiology 09/25/2021, recommend observation. Cervical radiculopathy: Last year, developed L shoulder pain, eventually was seen by neurosurgery, MRI it was known, was Dx with C4-C5 disease, was recommended conservative treatment and now he is completely asymptomatic.  In the process, he reported lower extremity paresthesias, a NCS was performed, diagnosed with neuropathy. Neuropathy: See above, blood work today. RTC 1 year  --T d 12-2014 - s/p shingrex x 2 - PNM 20 : 2022 - s/p covid vax, booster benefits d/w pt -rec flu shot q year -- prostate ca screening: + FH prostate cancer, father age 48, no symptoms, DRE normal 2021,  check PSA -- CCS: Cscope-- 2-08, polyp;  colonoscopy again 08-2012 (-), 10 years. CMP, FLP, CBC, A1c, TSH, PSA, Q11, folic acid,  RPR --Lifestyle: He was extremely active daily, then developed neck pain and has to slow down but he still remains very active - POA  discussed      Routine office visit, History of normal EKG: Labs Family history of prostate cancer: Labs Sees dermatology regularly Preventive care reviewed: See separate documentation RTC 1 year

## 2022-06-01 NOTE — Patient Instructions (Addendum)
Recommend to proceed with covid booster (bivalent) at your pharmacy.  Flu shot this fall     GO TO THE LAB : Get the blood work     Christian Mcgrath, [place] back for   a checkup in 1 year   "Living will", "[place] of attorney": Advanced care planning  (If you already have a living will or healthcare power of attorney, please bring the copy to be scanned in your chart.)  Advance care planning is a process that supports adults in  understanding and sharing their preferences regarding future medical care.   The patient's preferences are recorded in documents called Advance Directives.    Advanced directives are completed (and can be modified at any time) while the patient is in full mental capacity.   The documentation should be available at all times to the patient, the family and the healthcare providers.  Bring in a copy to be scanned in your chart is an excellent idea and is recommended   This legal documents direct treatment decision making and/or appoint a surrogate to make the decision if the patient is not capable to do so.    Advance directives can be documented in many types of formats,  documents have names such as:  Lliving will  Durable power of attorney for healthcare (healthcare proxy or healthcare power of attorney)  Combined directives  Physician orders for life-sustaining treatment    More information at:  [email or web address]

## 2022-06-02 LAB — COMPREHENSIVE METABOLIC PANEL
ALT: 13 U/L (ref 0–53)
AST: 22 U/L (ref 0–37)
Albumin: 4.3 g/dL (ref 3.5–5.2)
Alkaline Phosphatase: 72 U/L (ref 39–117)
BUN: 17 mg/dL (ref 6–23)
CO2: 28 mEq/L (ref 19–32)
Calcium: 9.3 mg/dL (ref 8.4–10.5)
Chloride: 106 mEq/L (ref 96–112)
Creatinine, Ser: 0.92 mg/dL (ref 0.40–1.50)
GFR: 86.9 mL/min (ref >60.00)
Glucose, Bld: 94 mg/dL (ref 70–99)
Potassium: 5.3 mEq/L — ABNORMAL HIGH (ref 3.5–5.1)
Sodium: 140 mEq/L (ref 135–145)
Total Bilirubin: 0.6 mg/dL (ref 0.2–1.2)
Total Protein: 6.6 g/dL (ref 6.0–8.3)

## 2022-06-02 LAB — LIPID PANEL
Cholesterol: 196 mg/dL (ref 0–200)
HDL: 41.1 mg/dL (ref >39.00)
LDL Cholesterol: 133 mg/dL — ABNORMAL HIGH (ref 0–99)
NonHDL: 155.07
Total CHOL/HDL Ratio: 5
Triglycerides: 109 mg/dL (ref 0.0–149.0)
VLDL: 21.8 mg/dL (ref 0.0–40.0)

## 2022-06-02 LAB — RPR: RPR Ser Ql: NONREACTIVE

## 2022-06-02 NOTE — Assessment & Plan Note (Signed)
Preventative care reviewed  --T d 12-2014 - s/p shingrex x 2 - PNM 20 : 2022 - s/p covid vax, booster benefits d/w pt -rec flu shot q year -- prostate ca screening: + FH prostate cancer, father age 66, no symptoms, DRE normal,  check PSA -- CCS: Cscope-- 2-08, polyp;  colonoscopy again 08-2012 (-), 10 years. - Labs: CMP, FLP, CBC, A1c, TSH, PSA, O48, folic acid, RPR --Lifestyle: He was extremely active daily, then developed neck pain and has to slow down but he still remains very active - POA  discussed

## 2022-06-02 NOTE — Assessment & Plan Note (Signed)
History of BCC: Sees dermatology regularly Brugada syndrome: Type II, asymptomatic, saw cardiology 09/25/2021, recommend observation. Cervical radiculopathy: Last year, developed L shoulder pain, eventually was seen by neurosurgery, MRI done:  was Dx with C4-C5 disease, was recommended conservative treatment and now he is completely asymptomatic.  In the process, he reported lower extremity paresthesias, a NCS was performed, diagnosed with neuropathy. Neuropathy: See above, blood work today. Preventive care reviewed with RTC 1 year

## 2022-07-20 ENCOUNTER — Ambulatory Visit: Payer: Medicare Other

## 2022-07-20 ENCOUNTER — Telehealth: Payer: Self-pay

## 2022-07-20 NOTE — Progress Notes (Deleted)
Subjective:   Christian Mcgrath is a 66 y.o. male who presents for an Initial Medicare Annual Wellness Visit.  I connected with Christian Mcgrath today by telephone and verified that I am speaking with the correct person using two identifiers. Location patient: home Location provider: work Persons participating in the virtual visit: patient, Marine scientist.    I discussed the limitations, risks, security and privacy concerns of performing an evaluation and management service by telephone and the availability of in person appointments. I also discussed with the patient that there may be a patient responsible charge related to this service. The patient expressed understanding and verbally consented to this telephonic visit.    Interactive audio and video telecommunications were attempted between this provider and patient, however failed, due to patient having technical difficulties OR patient did not have access to video capability.  We continued and completed visit with audio only.  Some vital signs may be absent or patient reported.   Time Spent with patient on telephone encounter: *** minutes   Review of Systems    ***       Objective:    There were no vitals filed for this visit. There is no height or weight on file to calculate BMI.      No data to display          Current Medications (verified) No outpatient encounter medications on file as of 07/20/2022.   No facility-administered encounter medications on file as of 07/20/2022.    Allergies (verified) Penicillins   History: Past Medical History:  Diagnosis Date   Abnormal ECG 08/04/2012   BCC (basal cell carcinoma of skin)    06-2010, sees derm   Colon polyps    Past Surgical History:  Procedure Laterality Date   COLONOSCOPY     HERNIA REPAIR     LEFT   KNEE ARTHROSCOPY     Left, age 42   Family History  Problem Relation Age of Onset   Prostate cancer Father        Dx age in early 35s   Cancer Father        oral, not  a smoker    Coronary artery disease Neg Hx    Hypertension Neg Hx    Stroke Neg Hx    Diabetes Neg Hx    Colon cancer Neg Hx    Social History   Socioeconomic History   Marital status: Married    Spouse name: Not on file   Number of children: 3   Years of education: Not on file   Highest education level: Not on file  Occupational History   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    Employer: [employer]  Tobacco Use   Smoking status: Never   Smokeless tobacco: Never  Substance and Sexual Activity   Alcohol use: Yes    Alcohol/week: 4.0 standard drinks of alcohol    Types: 4 Cans of beer per week    Comment: 4 beers weekends   Drug use: No   Sexual activity: Not on file  Other Topics Concern   Not on file  Social History Narrative   Mother and father > 76 y/o, independent, live in [place]             Social Determinants of Health   Financial Resource Strain: Not on file  Food Insecurity: Not on file  Transportation Needs: Not on file  Physical Activity: Not on file  Stress: Not on file  Social Connections: Not  on file    Tobacco Counseling Counseling given: Not Answered   Clinical Intake:                 Diabetic?No         Activities of Daily Living     No data to display          Patient Care Team: Colon Branch, MD as PCP - General  Indicate any recent Medical Services you may have received from other than [facility] providers in the past year (date may be approximate).     Assessment:   This is a routine wellness examination for Christian Mcgrath.  Hearing/Vision screen No results found.  Dietary issues and exercise activities discussed:     Goals Addressed   None    Depression Screen    06/01/2022    9:56 AM 07/16/2021    3:26 PM 05/28/2021    1:09 PM 05/10/2020    9:24 AM 05/04/2019    8:04 AM 05/02/2018    8:26 AM 04/26/2017    2:18 PM  PHQ 2/9 Scores  PHQ - 2 Score 0 0 0 0 0 0 0  PHQ- 9 Score  0         Fall Risk    06/01/2022    9:55 AM  07/16/2021    3:26 PM 05/28/2021    1:09 PM 05/02/2018    8:26 AM 04/26/2017    2:18 PM  Fall Risk   Falls in the past year? 0 0 0 No No  Number falls in past yr: 0 0 0    Injury with Fall? 0 0 0    Risk for fall due to :  No Fall Risks     Follow up Falls evaluation completed Falls evaluation completed Falls evaluation completed      [place]:  Any stairs in or around the home? {YES/NO:21197} If so, are there any without handrails? {YES/NO:21197} Home free of loose throw rugs in walkways, pet beds, electrical cords, etc? {YES/NO:21197} Adequate lighting in your home to reduce risk of falls? {YES/NO:21197}  ASSISTIVE DEVICES UTILIZED TO PREVENT FALLS:  Life alert? {YES/NO:21197} Use of a cane, walker or w/c? {YES/NO:21197} Grab bars in the bathroom? {YES/NO:21197} Shower chair or bench in shower? {YES/NO:21197} Elevated toilet seat or a handicapped toilet? {YES/NO:21197}  TIMED UP AND GO:  Was the test performed? No . Phone visit   Cognitive Function:        Immunizations Immunization History  Administered Date(s) Administered   Hep A / Hep B 01/29/2009, 02/28/2009, 04/06/2016   Influenza Whole 09/15/2007, 08/23/2009, 08/26/2013   Influenza,inj,Quad PF,6+ Mos 09/16/2018   Influenza-Unspecified 05/26/2014, 07/26/2020, 07/28/2021   PFIZER Comirnaty(Gray Top)Covid-19 Tri-Sucrose Vaccine 05/28/2021   PFIZER(Purple Top)SARS-COV-2 Vaccination 01/08/2020, 01/29/2020, 09/23/2020   PNEUMOCOCCAL CONJUGATE-20 05/28/2021   Td 09/20/2004   Tdap 12/31/2014   Zoster Recombinat (Shingrix) 09/16/2018, 11/10/2018    TDAP status: Up to date  Flu Vaccine status: Due, Education has been provided regarding the importance of this vaccine. Advised may receive this vaccine at local pharmacy or Health Dept. Aware to provide a copy of the vaccination record if obtained from local pharmacy or Health Dept. Verbalized acceptance and understanding.  Pneumococcal  vaccine status: Up to date  Covid-19 vaccine status: Information provided on how to obtain vaccines.   Qualifies for Shingles Vaccine? No   Zostavax completed No   Shingrix Completed?: Yes  Screening Tests Health Maintenance  Topic Date Due  COVID-19 Vaccine (5 - Pfizer risk series) 07/23/2021   INFLUENZA VACCINE  07/29/2022 (Originally 05/26/2022)   COLONOSCOPY (Pts 45-38yr Insurance coverage will need to be confirmed)  08/31/2022   TETANUS/TDAP  12/30/2024   Pneumonia Vaccine 66 Years old  Completed   Hepatitis C Screening  Completed   Zoster Vaccines- Shingrix  Completed   HPV VACCINES  Aged Out    Health Maintenance  Health Maintenance Due  Topic Date Due   COVID-19 Vaccine (5 - Pfizer risk series) 07/23/2021    Colorectal cancer screening: Type of screening: Colonoscopy. Completed 08/31/2012. Repeat every 10 years  Lung Cancer Screening: (Low Dose CT Chest recommended if Age 66-80years, 30 pack-year currently smoking OR have quit w/in 15years.) does not qualify.     Additional Screening:  Hepatitis C Screening: Completed 04/26/2017  Vision Screening: Recommended annual ophthalmology exams for early detection of glaucoma and other disorders of the eye. Is the patient up to date with their annual eye exam?  {YES/NO:21197} Who is the provider or what is the name of the office in which the patient attends annual eye exams? *** If pt is not established with a provider, would they like to be referred to a provider to establish care? {YES/NO:21197}.   Dental Screening: Recommended annual dental exams for proper oral hygiene  Community Resource Referral / Chronic Care Management: CRR required this visit?  {YES/NO:21197}  CCM required this visit?  {YES/NO:21197}     Plan:     I have personally reviewed and noted the following in the patient's chart:   Medical and social history Use of alcohol, tobacco or illicit drugs  Current medications and supplements  including opioid prescriptions. {Opioid Prescriptions:[id number]} Functional ability and status Nutritional status Physical activity Advanced directives List of other physicians Hospitalizations, surgeries, and ER visits in previous 12 months Vitals Screenings to include cognitive, depression, and falls Referrals and appointments  In addition, I have reviewed and discussed with patient certain preventive protocols, quality metrics, and best practice recommendations. A written personalized care plan for preventive services as well as general preventive health recommendations were provided to patient.   Due to this being a telephonic visit, the after visit summary with patients personalized plan was offered to patient via mail or my-chart. Patient would like to access on my-chart.   MMarta Antu LPN   [id number] Nurse Health Advisor  Nurse Notes: ***

## 2022-07-20 NOTE — Telephone Encounter (Signed)
Patient had a 11:15  phone visit scheduled for his Medicare Wellness visit today. Attempted to reach patient x 3. Left message to call back.

## 2022-08-06 ENCOUNTER — Ambulatory Visit (INDEPENDENT_AMBULATORY_CARE_PROVIDER_SITE_OTHER): Payer: Medicare Other | Admitting: *Deleted

## 2022-08-06 DIAGNOSIS — Z0000 Encounter for general adult medical examination without abnormal findings: Secondary | ICD-10-CM | POA: Diagnosis not present

## 2022-08-06 NOTE — Patient Instructions (Signed)
Christian Mcgrath , Thank you for taking time to come for your Medicare Wellness Visit. I appreciate your ongoing commitment to your health goals. Please review the following plan we discussed and let me know if I can assist you in the future.   These are the goals we discussed:  Goals   None     This is a list of the screening recommended for you and due dates:  Health Maintenance  Topic Date Due   COVID-19 Vaccine (5 - Pfizer risk series) 07/23/2021   Flu Shot  05/26/2022   Colon Cancer Screening  08/31/2022   Tetanus Vaccine  12/30/2024   Pneumonia Vaccine  Completed   Hepatitis C Screening: USPSTF Recommendation to screen - Ages 18-79 yo.  Completed   Zoster (Shingles) Vaccine  Completed   HPV Vaccine  Aged Out     Next appointment: Follow up in one year for your annual wellness visit.   Preventive Care 47 Years and Older, Male Preventive care refers to lifestyle choices and visits with your health care provider that can promote health and wellness. What does preventive care include? A yearly physical exam. This is also called an annual well check. Dental exams once or twice a year. Routine eye exams. Ask your health care provider how often you should have your eyes checked. Personal lifestyle choices, including: Daily care of your teeth and gums. Regular physical activity. Eating a healthy diet. Avoiding tobacco and drug use. Limiting alcohol use. Practicing safe sex. Taking low doses of aspirin every day. Taking vitamin and mineral supplements as recommended by your health care provider. What happens during an annual well check? The services and screenings done by your health care provider during your annual well check will depend on your age, overall health, lifestyle risk factors, and family history of disease. Counseling  Your health care provider may ask you questions about your: Alcohol use. Tobacco use. Drug use. Emotional well-being. Home and relationship  well-being. Sexual activity. Eating habits. History of falls. Memory and ability to understand (cognition). Work and work Statistician. Screening  You may have the following tests or measurements: Height, weight, and BMI. Blood pressure. Lipid and cholesterol levels. These may be checked every 5 years, or more frequently if you are over 75 years old. Skin check. Lung cancer screening. You may have this screening every year starting at age 87 if you have a 30-pack-year history of smoking and currently smoke or have quit within the past 15 years. Fecal occult blood test (FOBT) of the stool. You may have this test every year starting at age 32. Flexible sigmoidoscopy or colonoscopy. You may have a sigmoidoscopy every 5 years or a colonoscopy every 10 years starting at age 36. Prostate cancer screening. Recommendations will vary depending on your family history and other risks. Hepatitis C blood test. Hepatitis B blood test. Sexually transmitted disease (STD) testing. Diabetes screening. This is done by checking your blood sugar (glucose) after you have not eaten for a while (fasting). You may have this done every 1-3 years. Abdominal aortic aneurysm (AAA) screening. You may need this if you are a current or former smoker. Osteoporosis. You may be screened starting at age 76 if you are at high risk. Talk with your health care provider about your test results, treatment options, and if necessary, the need for more tests. Vaccines  Your health care provider may recommend certain vaccines, such as: Influenza vaccine. This is recommended every year. Tetanus, diphtheria, and acellular pertussis (Tdap,  Td) vaccine. You may need a Td booster every 10 years. Zoster vaccine. You may need this after age 76. Pneumococcal 13-valent conjugate (PCV13) vaccine. One dose is recommended after age 22. Pneumococcal polysaccharide (PPSV23) vaccine. One dose is recommended after age 63. Talk to your health care  provider about which screenings and vaccines you need and how often you need them. This information is not intended to replace advice given to you by your health care provider. Make sure you discuss any questions you have with your health care provider. Document Released: 11/08/2015 Document Revised: 07/01/2016 Document Reviewed: 08/13/2015 Elsevier Interactive Patient Education  2017 [place] Prevention in the Home Falls can cause injuries. They can happen to people of all ages. There are many things you can do to make your home safe and to help prevent falls. What can I do on the outside of my home? Regularly fix the edges of walkways and driveways and fix any cracks. Remove anything that might make you trip as you walk through a door, such as a raised step or threshold. Trim any bushes or trees on the path to your home. Use bright outdoor lighting. Clear any walking paths of anything that might make someone trip, such as rocks or tools. Regularly check to see if handrails are loose or broken. Make sure that both sides of any steps have handrails. Any raised decks and porches should have guardrails on the edges. Have any leaves, snow, or ice cleared regularly. Use sand or salt on walking paths during winter. Clean up any spills in your garage right away. This includes oil or grease spills. What can I do in the bathroom? Use night lights. Install grab bars by the toilet and in the tub and shower. Do not use towel bars as grab bars. Use non-skid mats or decals in the tub or shower. If you need to sit down in the shower, use a plastic, non-slip stool. Keep the floor dry. Clean up any water that spills on the floor as soon as it happens. Remove soap buildup in the tub or shower regularly. Attach bath mats securely with double-sided non-slip rug tape. Do not have throw rugs and other things on the floor that can make you trip. What can I do in the bedroom? Use night lights. Make  sure that you have a light by your bed that is easy to reach. Do not use any sheets or blankets that are too big for your bed. They should not hang down onto the floor. Have a firm chair that has side arms. You can use this for support while you get dressed. Do not have throw rugs and other things on the floor that can make you trip. What can I do in the kitchen? Clean up any spills right away. Avoid walking on wet floors. Keep items that you use a lot in easy-to-reach places. If you need to reach something above you, use a strong step stool that has a grab bar. Keep electrical cords out of the way. Do not use floor polish or wax that makes floors slippery. If you must use wax, use non-skid floor wax. Do not have throw rugs and other things on the floor that can make you trip. What can I do with my stairs? Do not leave any items on the stairs. Make sure that there are handrails on both sides of the stairs and use them. Fix handrails that are broken or loose. Make sure that handrails are as  long as the stairways. Check any carpeting to make sure that it is firmly attached to the stairs. Fix any carpet that is loose or worn. Avoid having throw rugs at the top or bottom of the stairs. If you do have throw rugs, attach them to the floor with carpet tape. Make sure that you have a light switch at the top of the stairs and the bottom of the stairs. If you do not have them, ask someone to add them for you. What else can I do to help prevent falls? Wear shoes that: Do not have high heels. Have rubber bottoms. Are comfortable and fit you well. Are closed at the toe. Do not wear sandals. If you use a stepladder: Make sure that it is fully opened. Do not climb a closed stepladder. Make sure that both sides of the stepladder are locked into place. Ask someone to hold it for you, if possible. Clearly mark and make sure that you can see: Any grab bars or handrails. First and last steps. Where the  edge of each step is. Use tools that help you move around (mobility aids) if they are needed. These include: Canes. Walkers. Scooters. Crutches. Turn on the lights when you go into a dark area. Replace any light bulbs as soon as they burn out. Set up your furniture so you have a clear path. Avoid moving your furniture around. If any of your floors are uneven, fix them. If there are any pets around you, be aware of where they are. Review your medicines with your doctor. Some medicines can make you feel dizzy. This can increase your chance of falling. Ask your doctor what other things that you can do to help prevent falls. This information is not intended to replace advice given to you by your health care provider. Make sure you discuss any questions you have with your health care provider. Document Released: 08/08/2009 Document Revised: 03/19/2016 Document Reviewed: 11/16/2014 Elsevier Interactive Patient Education  2017 [employer].

## 2022-08-06 NOTE — Progress Notes (Signed)
Subjective:   Christian Mcgrath is a 66 y.o. male who presents for Medicare Annual/Subsequent preventive examination.  I connected with  [name] on 08/06/22 by a audio enabled telemedicine application and verified that I am speaking with the correct person using two identifiers.  Patient Location: Home  Provider Location: Office/Clinic  I discussed the limitations of evaluation and management by telemedicine. The patient expressed understanding and agreed to proceed.   Review of Systems    Defer to PCP Cardiac Risk Factors include: advanced age (>49mn, >>49women);male gender     Objective:    There were no vitals filed for this visit. There is no height or weight on file to calculate BMI.     08/06/2022    1:59 PM  Advanced Directives  Does Patient Have a Medical Advance Directive? No  Would patient like information on creating a medical advance directive? No - Patient declined    Current Medications (verified) No outpatient encounter medications on file as of 08/06/2022.   No facility-administered encounter medications on file as of 08/06/2022.    Allergies (verified) Penicillins   History: Past Medical History:  Diagnosis Date   Abnormal ECG 08/04/2012   BCC (basal cell carcinoma of skin)    06-2010, sees derm   Colon polyps    Past Surgical History:  Procedure Laterality Date   COLONOSCOPY     HERNIA REPAIR     LEFT   KNEE ARTHROSCOPY     Left, age 66  Family History  Problem Relation Age of Onset   Prostate cancer Father        Dx age in early 763s  Cancer Father        oral, not a smoker    Coronary artery disease Neg Hx    Hypertension Neg Hx    Stroke Neg Hx    Diabetes Neg Hx    Colon cancer Neg Hx    Social History   Socioeconomic History   Marital status: Married    Spouse name: Not on file   Number of children: 3   Years of education: Not on file   Highest education level: Not on file  Occupational History    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    Employer: [employer]  Tobacco Use   Smoking status: Never   Smokeless tobacco: Never  Substance and Sexual Activity   Alcohol use: Yes    Alcohol/week: 4.0 standard drinks of alcohol    Types: 4 Cans of beer per week    Comment: 4 beers weekends   Drug use: No   Sexual activity: Not on file  Other Topics Concern   Not on file  Social History Narrative   Mother and father > 968y/o, independent[place]   Financial Resource Strain: Low Risk  (08/06/2022)   Overall Financial Resource Strain (CARDIA)    Difficulty of Paying Living Expenses: Not hard at all  Food Insecurity: No Food Insecurity (08/06/2022)   Hunger Vital Sign    Worried About Running Out of Food in the Last Year: Never true    Ran Out of Food in the Last Year: Never true  Transportation Needs: No Transportation Needs (08/06/2022)   PRAPARE - THydrologist(Medical): No    Lack of Transportation (Non-Medical): No  Physical Activity: Sufficiently Active (08/06/2022)  Exercise Vital Sign    Days of Exercise per Week: 6 days    Minutes of Exercise per Session: 90 min  Stress: No Stress Concern Present (08/06/2022)   [place]    Feeling of Stress : Not at all  Social Connections: [place] (08/06/2022)   Social Connection and Isolation Panel [NHANES]    Frequency of Communication with Friends and Family: More than three times a week    Frequency of Social Gatherings with Friends and Family: More than three times a week    Attends Religious Services: More than 4 times per year    Active Member of [employer] or Organizations: Yes    Attends Music therapist: More than 4 times per year    Marital Status: Married    Tobacco Counseling Counseling given: Not Answered   Clinical Intake:  Pre-visit preparation  completed: Yes  Pain : No/denies pain  How often do you need to have someone help you when you read instructions, pamphlets, or other written materials from your doctor or pharmacy?: 1 - Never  Diabetic? No  Interpreter Needed?: No  Information entered by :: Beatris Ship, [place]   Activities of Daily Living    08/06/2022    2:00 PM  In your present state of health, do you have any difficulty performing the following activities:  Hearing? 0  Vision? 0  Difficulty concentrating or making decisions? 0  Walking or climbing stairs? 0  Dressing or bathing? 0  Doing errands, shopping? 0  Preparing Food and eating ? N  Using the Toilet? N  In the past six months, have you accidently leaked urine? N  Do you have problems with loss of bowel control? N  Managing your Medications? N  Managing your Finances? N  Housekeeping or managing your Housekeeping? N    Patient Care Team: Colon Branch, MD as PCP - General  Indicate any recent Medical Services you may have received from other than [facility] providers in the past year (date may be approximate).     Assessment:   This is a routine wellness examination for Christian Mcgrath.  Hearing/Vision screen No results found.  Dietary issues and exercise activities discussed: Current Exercise Habits: Home exercise routine, Type of exercise: calisthenics;strength training/weights;walking;Other - see comments (swimming, biking, running), Time (Minutes): > 60, Frequency (Times/Week): 5, Weekly Exercise (Minutes/Week): 0, Intensity: Moderate, Exercise limited by: None identified   Goals Addressed   None    Depression Screen    08/06/2022    2:00 PM 06/01/2022    9:56 AM 07/16/2021    3:26 PM 05/28/2021    1:09 PM 05/10/2020    9:24 AM 05/04/2019    8:04 AM 05/02/2018    8:26 AM  PHQ 2/9 Scores  PHQ - 2 Score 0 0 0 0 0 0 0  PHQ- 9 Score   0        Fall Risk    08/06/2022    1:59 PM 06/01/2022    9:55 AM 07/16/2021    3:26 PM 05/28/2021    1:09 PM 05/02/2018     8:26 AM  [place] in the past year? 0 0 0 0 No  Number falls in past yr: 0 0 0 0   Injury with Fall? 0 0 0 0   Risk for fall due to : No Fall Risks  No Fall Risks    Follow up Falls evaluation completed Falls  evaluation completed Falls evaluation completed Falls evaluation completed     FALL RISK PREVENTION PERTAINING TO THE HOME:  Any stairs in or around the home? Yes  If so, are there any without handrails? No  Home free of loose throw rugs in walkways, pet beds, electrical cords, etc? Yes  Adequate lighting in your home to reduce risk of falls? Yes   ASSISTIVE DEVICES UTILIZED TO PREVENT FALLS:  Life alert? No  Use of a cane, walker or w/c? No  Grab bars in the bathroom? No  Shower chair or bench in shower? No  Elevated toilet seat or a handicapped toilet? No   TIMED UP AND GO:  Was the test performed?  Audio visit .   Cognitive Function:        08/06/2022    2:05 PM  6CIT Screen  What Year? 0 points  What month? 0 points  What time? 0 points  Count back from 20 0 points  Months in reverse 0 points  Repeat phrase 2 points  Total Score 2 points    Immunizations Immunization History  Administered Date(s) Administered   Hep A / Hep B 01/29/2009, 02/28/2009, 04/06/2016   Influenza Whole 09/15/2007, 08/23/2009, 08/26/2013   Influenza,inj,Quad PF,6+ Mos 09/16/2018   Influenza-Unspecified 05/26/2014, 07/26/2020, 07/28/2021   PFIZER Comirnaty(Gray Top)Covid-19 Tri-Sucrose Vaccine 05/28/2021   PFIZER(Purple Top)SARS-COV-2 Vaccination 01/08/2020, 01/29/2020, 09/23/2020   PNEUMOCOCCAL CONJUGATE-20 05/28/2021   Td 09/20/2004   Tdap 12/31/2014   Zoster Recombinat (Shingrix) 09/16/2018, 11/10/2018    TDAP status: Up to date  Flu Vaccine status: Due, Education has been provided regarding the importance of this vaccine. Advised may receive this vaccine at local pharmacy or Health Dept. Aware to provide a copy of the vaccination record if obtained from  local pharmacy or Health Dept. Verbalized acceptance and understanding.  Pneumococcal vaccine status: Up to date  Covid-19 vaccine status: Information provided on how to obtain vaccines.   Qualifies for Shingles Vaccine? Yes   Zostavax completed No   Shingrix Completed?: Yes  Screening Tests Health Maintenance  Topic Date Due   COVID-19 Vaccine (5 - Pfizer risk series) 07/23/2021   INFLUENZA VACCINE  05/26/2022   COLONOSCOPY (Pts 45-26yr Insurance coverage will need to be confirmed)  08/31/2022   TETANUS/TDAP  12/30/2024   Pneumonia Vaccine 66 Years old  Completed   Hepatitis C Screening  Completed   Zoster Vaccines- Shingrix  Completed   HPV VACCINES  Aged Out    Health Maintenance  Health Maintenance Due  Topic Date Due   COVID-19 Vaccine (5 - Pfizer risk series) 07/23/2021   INFLUENZA VACCINE  05/26/2022    Colorectal cancer screening: Type of screening: Colonoscopy. Completed 08/31/12. Repeat every 10 years  Lung Cancer Screening: (Low Dose CT Chest recommended if Age 66-80years, 30 pack-year currently smoking OR have quit w/in 15years.) does not qualify.   Lung Cancer Screening Referral: N/a  Additional Screening:  Hepatitis C Screening: does qualify; Completed 04/26/17  Vision Screening: Recommended annual ophthalmology exams for early detection of glaucoma and other disorders of the eye. Is the patient up to date with their annual eye exam?  Yes  Who is the provider or what is the name of the office in which the patient attends annual eye exams? Doesn't remember name of new eye doctor If pt is not established with a provider, would they like to be referred to a provider to establish care? No .   Dental Screening: Recommended annual dental exams for  proper oral hygiene  Community Resource Referral / Chronic Care Management: CRR required this visit?  No   CCM required this visit?  No      Plan:     I have personally reviewed and noted the following in  the patient's chart:   Medical and social history Use of alcohol, tobacco or illicit drugs  Current medications and supplements including opioid prescriptions. Patient is not currently taking opioid prescriptions. Functional ability and status Nutritional status Physical activity Advanced directives List of other physicians Hospitalizations, surgeries, and ER visits in previous 12 months Vitals Screenings to include cognitive, depression, and falls Referrals and appointments  In addition, I have reviewed and discussed with patient certain preventive protocols, quality metrics, and best practice recommendations. A written personalized care plan for preventive services as well as general preventive health recommendations were provided to patient.   Due to this being a telephonic visit, the after visit summary with patients personalized plan was offered to patient via mail or my-chart.  Patient would like to access on my-chart.  Beatris Ship, [place]   08/06/2022   Nurse Notes: None

## 2022-08-13 ENCOUNTER — Ambulatory Visit (INDEPENDENT_AMBULATORY_CARE_PROVIDER_SITE_OTHER): Payer: Medicare Other | Admitting: Neurology

## 2022-08-13 ENCOUNTER — Encounter: Payer: Self-pay | Admitting: Neurology

## 2022-08-13 VITALS — BP 127/87 | HR 60 | Ht 69.0 in | Wt 187.0 lb

## 2022-08-13 DIAGNOSIS — G629 Polyneuropathy, unspecified: Secondary | ICD-10-CM

## 2022-08-13 DIAGNOSIS — R200 Anesthesia of skin: Secondary | ICD-10-CM

## 2022-08-13 DIAGNOSIS — M79602 Pain in left arm: Secondary | ICD-10-CM | POA: Diagnosis not present

## 2022-08-13 NOTE — Progress Notes (Signed)
[facility]  PATIENT: Christian Mcgrath DOB: 04-07-1956  REFERRING DOCTOR OR PCP: Kary Kos MD; Belinda Fisher, MD SOURCE: Patient, notes from neurosurgery, NCV/EMG report and data, imaging and lab reports, MRI images personally reviewed.  _________________________________   HISTORICAL  CHIEF COMPLAINT:  Chief Complaint  Patient presents with   New Patient (Initial Visit)    Rm 2, pt alone. He started riding bike twice a week and swimming and running and doing a lot of things he wasn't used to. Pt hurt himself yr ago and he was thinking muscle spasm. Had a work up, He has tingling in fingers and he had a cardiac workup due to irregular EKG was referred to neurosurgeon had a completed MRI. He didn't need surgery. He had pain in shoulder down into the fingers which has now gone away. ? Numbness in left big toe EMG/EEG was completed. This doesn't seem to be painful.    Other    Overall stable at this time but wanted to keep apt to be established in the event worsens. He said also no one ever really explained previous EMG studies to him.    HISTORY OF PRESENT ILLNESS:  I had the pleasure seeing your patient, Christian Mcgrath, at [facility] for neurologic consultation regarding his leg and foot numbness.  He is a 66 year old man who is being referred for possible polyneuropathy.   Though he does not know if this is related, one year ago, after laying golf, he noted that the left arm was painful and he had spasms.   He had no benefit from spasm medications so saw orthopedics.  He had an injection at the elbow with benefit.   Since then the left arm has done well.    He was doing very active and does triathlons but stopped when the arm issues occurred.   He is easing back into more athletics/exercise.    Around that time, he began to experience tingling in the big toe on the left and then in both lower legs.   He continues to note mild numbness but no pain or  weakness in his feet.   He remains active.  Balance is fine.  Bladder function is fine.    NCV/EMG (Dr. 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   Peroneal and tibial moror responses were reduced in amplitude and slowed   F wave latencies and H-reflex latencies were prolonged.    Sural sensory responses were reduced in amplitude and saphenous sensory responses were absent.   EMG showed mild chronic denervation in the EDB EMG of the left arm had shown moderate left median neuropathy at the wrist and mild ulnar neuropathy at the elbow and wrist  Imaging: X-ray of the cervical spine 08/12/2021 shows osteopenia and degenerative changes with osteophytes and facet hypertrophy.  We received a copy of the cervical MRI report.  Unfortunately, the fax is practically impossible to read but I think it says multilevel degenerative changes with canal stenosis at C5-C6 and multilevel foraminal narrowing.  We will try to get a repeat report and the images.  Addendum: The patient cervical MRI was placed in PACS under the wrong name Steve Rattler) that I was able to review the actual images.  At C3-C4, There is bilateral moderately severe foraminal narrowing that could impact the C4 nerve roots.  At C4-C5, there is mild foraminal narrowing.  At C5-C6, there is moderate spinal stenosis and left greater than right foraminal  narrowing with potential for left C6 nerve root compression.  At C6-C7, there is left foraminal narrowing with potential for left C7 nerve root compression.  At C7-T1, there is mild spinal stenosis and foraminal narrowing but no nerve root compression.  REVIEW OF SYSTEMS: Constitutional: No fevers, chills, sweats, or change in appetite Eyes: No visual changes, double vision, eye pain Ear, nose and throat: No hearing loss, ear pain, nasal congestion, sore throat Cardiovascular: No chest pain, palpitations Respiratory:  No shortness of breath at rest or with  exertion.   No wheezes GastrointestinaI: No nausea, vomiting, diarrhea, abdominal pain, fecal incontinence Genitourinary:  No dysuria, urinary retention or frequency.  No nocturia. Musculoskeletal:  No neck pain, back pain Integumentary: No rash, pruritus, skin lesions Neurological: as above Psychiatric: No depression at this time.  No anxiety Endocrine: No palpitations, diaphoresis, change in appetite, change in weigh or increased thirst Hematologic/Lymphatic:  No anemia, purpura, petechiae. Allergic/Immunologic: No itchy/runny eyes, nasal congestion, recent allergic reactions, rashes  ALLERGIES: Allergies  Allergen Reactions   Penicillins Other (See Comments)    "never had,Dad had a reaction"    HOME MEDICATIONS: No current outpatient medications on file.  PAST MEDICAL HISTORY: Past Medical History:  Diagnosis Date   Abnormal ECG 08/04/2012   BCC (basal cell carcinoma of skin)    06-2010, sees derm   Colon polyps     PAST SURGICAL HISTORY: Past Surgical History:  Procedure Laterality Date   COLONOSCOPY     HERNIA REPAIR     LEFT   KNEE ARTHROSCOPY     Left, age 66    FAMILY HISTORY: Family History  Problem Relation Age of Onset   Prostate cancer Father        Dx age in early 48s   Cancer Father        oral, not a smoker    Coronary artery disease Neg Hx    Hypertension Neg Hx    Stroke Neg Hx    Diabetes Neg Hx    Colon cancer Neg Hx     SOCIAL HISTORY: Social History   Socioeconomic History   Marital status: Married    Spouse name: Not on file   Number of children: 3   Years of education: Not on file   Highest education level: Not on file  Occupational History   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    Employer: [employer]  Tobacco Use   Smoking status: Never   Smokeless tobacco: Never  Substance and Sexual Activity   Alcohol use: Yes    Alcohol/week: 4.0 standard drinks of alcohol    Types: 4 Cans of beer per week    Comment: 4 beers weekends    Drug use: No   Sexual activity: Not on file  Other Topics Concern   Not on file  Social History Narrative   Mother and father > 80 y/o, independent, live in [place] [place] [employer] Determinants of Health   Financial Resource Strain: Low Risk  (08/06/2022)   Overall Financial Resource Strain (CARDIA)    Difficulty of Paying Living Expenses: Not hard at all  Food Insecurity: No Food Insecurity (08/06/2022)   Hunger Vital Sign    Worried About Running Out of Food in the Last Year: Never true    Ran Out of Food in the Last Year: Never true  Transportation Needs: No Transportation Needs (08/06/2022)   PRAPARE - Transportation  Lack of Transportation (Medical): No    Lack of Transportation (Non-Medical): No  Physical Activity: Sufficiently Active (08/06/2022)   Exercise Vital Sign    Days of Exercise per Week: 6 days    Minutes of Exercise per Session: 90 min  Stress: No Stress Concern Present (08/06/2022)   [place]    Feeling of Stress : Not at all  Social Connections: [place] (08/06/2022)   Social Connection and Isolation Panel [NHANES]    Frequency of Communication with Friends and Family: More than three times a week    Frequency of Social Gatherings with Friends and Family: More than three times a week    Attends Religious Services: More than 4 times per year    Active Member of [employer] or Organizations: Yes    Attends Archivist Meetings: More than 4 times per year    Marital Status: Married  Human resources officer Violence: Not At Risk (08/06/2022)   Humiliation, Afraid, Rape, and Kick questionnaire    Fear of Current or Ex-Partner: No    Emotionally Abused: No    Physically Abused: No    Sexually Abused: No       PHYSICAL EXAM  Vitals:   08/13/22 1011  BP: 127/87  Pulse: 60  Weight: 187 lb (84.8 kg)  Height: '5\' 9"'  (1.753 m)    Body mass index is 27.62 kg/m.   General:  The patient is well-developed and well-nourished and in no acute distress  HEENT:  Head is /AT.  Sclera are anicteric.  Funduscopic exam shows normal optic discs and retinal vessels.  Neck: No carotid bruits are noted.  The neck is nontender.  Cardiovascular: The heart has a regular rate and rhythm with a normal S1 and S2. There were no murmurs, gallops or rubs.    Skin: Extremities are without rash or  edema.  Musculoskeletal:  Back is nontender  Neurologic Exam  Mental status: The patient is alert and oriented x 3 at the time of the examination. The patient has apparent normal recent and remote memory, with an apparently normal attention span and concentration ability.   Speech is normal.  Cranial nerves: Extraocular movements are full. Pupils are equal, round, and reactive to light and accomodation.  Visual fields are full.  Facial symmetry is present. There is good facial sensation to soft touch bilaterally.Facial strength is normal.  Trapezius and sternocleidomastoid strength is normal. No dysarthria is noted.  The tongue is midline, and the patient has symmetric elevation of the soft palate. No obvious hearing deficits are noted.  Motor:  Muscle bulk is normal.   Tone is normal. Strength is  5 / 5 in all 4 extremities except 4++/5 in intrinsic foot muscles..   Sensory: Sensory testing is intact to pinprick, soft touch and vibration sensation in the arms.  Normal pinprick sensation in feet and legs.    Vibration sensation ws 80% at the anles and 60% at the great toe.  Coordination: Cerebellar testing reveals good finger-nose-finger and heel-to-shin bilaterally.  Gait and station: Station is normal.   Gait is normal. Tandem gait is normal. Romberg is negative.   Reflexes: Deep tendon reflexes are symmetric and normal bilaterally.   Plantar responses are flexor.    DIAGNOSTIC DATA (LABS, IMAGING, TESTING) - I reviewed patient records, labs, notes, testing and imaging myself where  available.  Lab Results  Component Value Date   WBC 4.3 06/01/2022   HGB 14.7 06/01/2022  HCT 43.5 06/01/2022   MCV 91.5 06/01/2022   PLT 179.0 06/01/2022      Component Value Date/Time   NA 140 06/01/2022 1028   K 5.3 (H) 06/01/2022 1028   CL 106 06/01/2022 1028   CO2 28 06/01/2022 1028   GLUCOSE 94 06/01/2022 1028   BUN 17 06/01/2022 1028   CREATININE 0.92 06/01/2022 1028   CALCIUM 9.3 06/01/2022 1028   PROT 6.6 06/01/2022 1028   ALBUMIN 4.3 06/01/2022 1028   AST 22 06/01/2022 1028   ALT 13 06/01/2022 1028   ALKPHOS 72 06/01/2022 1028   BILITOT 0.6 06/01/2022 1028   GFRNONAA 82.84 01/06/2010 1524   GFRAA 114 12/30/2007 1029   Lab Results  Component Value Date   CHOL 196 06/01/2022   HDL 41.10 06/01/2022   LDLCALC 133 (H) 06/01/2022   LDLDIRECT 149.5 10/20/2013   TRIG 109.0 06/01/2022   CHOLHDL 5 06/01/2022   Lab Results  Component Value Date   HGBA1C 5.4 06/01/2022   Lab Results  Component Value Date   VITAMINB12 339 06/01/2022   Lab Results  Component Value Date   TSH 2.82 06/01/2022       ASSESSMENT AND PLAN  Large fiber neuropathy - Plan: Kappa/lambda light chains, Multiple Myeloma Panel (SPEP&IFE w/QIG), Vitamin B12, Rheumatoid factor, Sjogren's syndrome antibods(ssa + ssb), Copper, serum  Pain of left upper extremity - Plan: Kappa/lambda light chains, Multiple Myeloma Panel (SPEP&IFE w/QIG), Vitamin B12, Rheumatoid factor, Sjogren's syndrome antibods(ssa + ssb), Copper, serum  Numbness - Plan: Kappa/lambda light chains, Multiple Myeloma Panel (SPEP&IFE w/QIG), Vitamin B12, Rheumatoid factor, Sjogren's syndrome antibods(ssa + ssb), Copper, serum  Polyneuropathy   In summary, Mr. Bonfield is a 66 year old man reporting numbness without pain in both feet.  On examination, he has reduced vibration sensation but intact pinprick sensation could be consistent with a length-dependent large fiber polyneuropathy.  This is supported by the findings of  the NCV/EMG study.  Although many neuropathies are idiopathic, we need to check for treatable causes of large fiber polyneuropathy.  I will check lab work for B12, SPEP/IEF, serum copper and SSA/SSB.  Since symptoms are not painful, there is no need for treatment with gabapentin or other agents.  Based on the results of the studies additional testing or referral may be necessary.  I will see him back as needed for new or worsening symptoms.  Thank you for asking me to see this patient.  Please let me know if I can be of further assistance with him or other patients in the future.   Annaliah Rivenbark A. Felecia Shelling, MD, [facility] 48/10/6551, 74:82 AM Certified in Neurology, Clinical Neurophysiology, Sleep Medicine and Neuroimaging  [facility] [address] [place], [place] [address] [phone]

## 2022-08-17 ENCOUNTER — Encounter: Payer: Self-pay | Admitting: Gastroenterology

## 2022-08-23 LAB — MULTIPLE MYELOMA PANEL, SERUM
Albumin SerPl Elph-Mcnc: 3.9 g/dL (ref 2.9–4.4)
Albumin/Glob SerPl: 1.6 (ref 0.7–1.7)
Alpha 1: 0.2 g/dL (ref 0.0–0.4)
Alpha2 Glob SerPl Elph-Mcnc: 0.5 g/dL (ref 0.4–1.0)
B-Globulin SerPl Elph-Mcnc: 0.9 g/dL (ref 0.7–1.3)
Gamma Glob SerPl Elph-Mcnc: 0.9 g/dL (ref 0.4–1.8)
Globulin, Total: 2.5 g/dL (ref 2.2–3.9)
IgA/Immunoglobulin A, Serum: 122 mg/dL (ref 61–437)
IgG (Immunoglobin G), Serum: 912 mg/dL (ref 603–1613)
IgM (Immunoglobulin M), Srm: 108 mg/dL (ref 20–172)
Total Protein: 6.4 g/dL (ref 6.0–8.5)

## 2022-08-23 LAB — KAPPA/LAMBDA LIGHT CHAINS
Ig Kappa Free Light Chain: 15 mg/L (ref 3.3–19.4)
Ig Lambda Free Light Chain: 13.2 mg/L (ref 5.7–26.3)
KAPPA/LAMBDA RATIO: 1.14 (ref 0.26–1.65)

## 2022-08-23 LAB — SJOGREN'S SYNDROME ANTIBODS(SSA + SSB)
ENA SSA (RO) Ab: <0.2 AI (ref 0.0–0.9)
ENA SSB (LA) Ab: <0.2 AI (ref 0.0–0.9)

## 2022-08-23 LAB — VITAMIN B12: Vitamin B-12: 504 pg/mL (ref 232–1245)

## 2022-08-23 LAB — COPPER, SERUM: Copper: 109 ug/dL (ref 69–132)

## 2022-08-23 LAB — RHEUMATOID FACTOR: Rheumatoid fact SerPl-aCnc: <10 IU/mL (ref <14.0)

## 2022-09-11 ENCOUNTER — Ambulatory Visit (AMBULATORY_SURGERY_CENTER): Payer: Self-pay | Admitting: *Deleted

## 2022-09-11 VITALS — Ht 69.0 in | Wt 189.6 lb

## 2022-09-11 DIAGNOSIS — Z1211 Encounter for screening for malignant neoplasm of colon: Secondary | ICD-10-CM

## 2022-09-11 MED ORDER — NA SULFATE-K SULFATE-MG SULF 17.5-3.13-1.6 GM/177ML PO SOLN: 1.0000 | Freq: Once | ORAL | 0 refills | Status: AC

## 2022-09-11 NOTE — Progress Notes (Unsigned)
No egg or soy allergy known to patient  No issues known to pt with past sedation with any surgeries or procedures Patient denies ever being told they had issues or difficulty with intubation  No FH of Malignant Hyperthermia Pt is not on diet pills Pt is not on  home 02  Pt is not on blood thinners  Pt denies issues with constipation  Pt encouraged to use to use Singlecare or Goodrx to reduce cost  Patient's chart reviewed by Christian Mcgrath CNRA prior to previsit and patient appropriate for the [facility].  Previsit completed and red dot placed by patient's name on their procedure day (on provider's schedule).     

## 2022-10-09 ENCOUNTER — Encounter: Payer: Self-pay | Admitting: Gastroenterology

## 2022-10-09 ENCOUNTER — Ambulatory Visit (AMBULATORY_SURGERY_CENTER): Payer: Medicare Other | Admitting: Gastroenterology

## 2022-10-09 VITALS — BP 119/69 | HR 49 | Temp 96.8°F | Resp 10 | Ht 69.0 in | Wt 189.6 lb

## 2022-10-09 DIAGNOSIS — D125 Benign neoplasm of sigmoid colon: Secondary | ICD-10-CM

## 2022-10-09 DIAGNOSIS — D123 Benign neoplasm of transverse colon: Secondary | ICD-10-CM

## 2022-10-09 DIAGNOSIS — Z1211 Encounter for screening for malignant neoplasm of colon: Secondary | ICD-10-CM

## 2022-10-09 DIAGNOSIS — D122 Benign neoplasm of ascending colon: Secondary | ICD-10-CM

## 2022-10-09 DIAGNOSIS — D128 Benign neoplasm of rectum: Secondary | ICD-10-CM

## 2022-10-09 MED ORDER — SODIUM CHLORIDE 0.9 % IV SOLN: 500.0000 mL | INTRAVENOUS | Status: DC

## 2022-10-09 NOTE — Patient Instructions (Addendum)
Read all of the handouts given to you by your recovery room nurse.  Resume all of your medications as ordered.  YOU HAD AN ENDOSCOPIC PROCEDURE TODAY AT [facility]:   Refer to the procedure report that was given to you for any specific questions about what was found during the examination.  If the procedure report does not answer your questions, please call your gastroenterologist to clarify.  If you requested that your care partner not be given the details of your procedure findings, then the procedure report has been included in a sealed envelope for you to review at your convenience later.  YOU SHOULD EXPECT: Some feelings of bloating in the abdomen. Passage of more gas than usual.  Walking can help get rid of the air that was put into your GI tract during the procedure and reduce the bloating. If you had a lower endoscopy (such as a colonoscopy or flexible sigmoidoscopy) you may notice spotting of blood in your stool or on the toilet paper. If you underwent a bowel prep for your procedure, you may not have a normal bowel movement for a few days.  Please Note:  You might notice some irritation and congestion in your nose or some drainage.  This is from the oxygen used during your procedure.  There is no need for concern and it should clear up in a day or so.  SYMPTOMS TO REPORT IMMEDIATELY:  Following lower endoscopy (colonoscopy or flexible sigmoidoscopy):  Excessive amounts of blood in the stool  Significant tenderness or worsening of abdominal pains  Swelling of the abdomen that is new, acute  Fever of 100F or higher    For urgent or emergent issues, a gastroenterologist can be reached at any hour by calling [phone]. Do not use MyChart messaging for urgent concerns.    DIET:  We do recommend a small meal at first, but then you may proceed to your regular diet.  Drink plenty of fluids but you should avoid alcoholic beverages for 24 hours.  ACTIVITY:  You should  plan to take it easy for the rest of today and you should NOT DRIVE or use heavy machinery until tomorrow (because of the sedation medicines used during the test).    FOLLOW UP: Our staff will call the number listed on your records the next business day following your procedure.  We will call around 7:15- 8:00 am to check on you and address any questions or concerns that you may have regarding the information given to you following your procedure. If we do not reach you, we will leave a message.     If any biopsies were taken you will be contacted by phone or by letter within the next 1-3 weeks.  Please call us at [phone] if you have not heard about the biopsies in 3 weeks.    SIGNATURES/CONFIDENTIALITY: You and/or your care partner have signed paperwork which will be entered into your electronic medical record.  These signatures attest to the fact that that the information above on your After Visit Summary has been reviewed and is understood.  Full responsibility of the confidentiality of this discharge information lies with you and/or your care-partner.

## 2022-10-09 NOTE — Progress Notes (Signed)
[facility] History and Physical   Primary Care Physician:  Colon Branch, MD   Reason for Procedure:   Colon cancer screening  Plan:    Screening colonoscopy     HPI: Christian Mcgrath is a 66 y.o. male undergoing screening colonoscopy.  Initial colonscopy in 2007 with multiple small polyps in the rectum/sigmoid in including a rectal carcinoid tumor.  Repeat colonoscopy in 2008 with no residual tumor.  His last colonoscopy in 2013 was normal.  No family history of colon cancer and no chronic GI symptoms.   Past Medical History:  Diagnosis Date   Abnormal ECG 08/04/2012   BCC (basal cell carcinoma of skin)    06-2010, sees derm   Colon polyps     Past Surgical History:  Procedure Laterality Date   COLONOSCOPY     HERNIA REPAIR     LEFT   KNEE ARTHROSCOPY     Left, age 44   VASECTOMY      Prior to Admission medications   Medication Sig Start Date End Date Taking? Authorizing Provider  Ibuprofen (ADVIL PO) Take by mouth as needed.    [provider]    Current Outpatient Medications  Medication Sig Dispense Refill   Ibuprofen (ADVIL PO) Take by mouth as needed.     Current Facility-Administered Medications  Medication Dose Route Frequency Provider Last Rate Last Admin   0.9 %  sodium chloride infusion  500 mL Intravenous Continuous Daryel November, MD        Allergies as of 10/09/2022 - Review Complete 10/09/2022  Allergen Reaction Noted   Penicillins Other (See Comments)     Family History  Problem Relation Age of Onset   Prostate cancer Father        Dx age in early 5s   Cancer Father        oral, not a smoker    Coronary artery disease Neg Hx    Hypertension Neg Hx    Stroke Neg Hx    Diabetes Neg Hx    Colon cancer Neg Hx    Rectal cancer Neg Hx    Stomach cancer Neg Hx     Social History   Socioeconomic History   Marital status: Married    Spouse name: Not on file   Number of children: 3   Years of education: Not on  file   Highest education level: Not on file  Occupational History   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    Employer: [employer]  Tobacco Use   Smoking status: Never   Smokeless tobacco: Never  Vaping Use   Vaping Use: Never used  Substance and Sexual Activity   Alcohol use: Yes    Alcohol/week: 4.0 standard drinks of alcohol    Types: 4 Cans of beer per week    Comment: 4 beers weekends   Drug use: No   Sexual activity: Not on file  Other Topics Concern   Not on file  Social History Narrative   Mother and father > 32 y/o, independent, live in [place] Determinants of Health   Financial Resource Strain: Low Risk  (08/06/2022)   Overall Financial Resource Strain (CARDIA)    Difficulty of Paying Living Expenses: Not hard at all  Food Insecurity: No Food Insecurity (08/06/2022)   Hunger Vital Sign    Worried About Running Out of Food in the Last Year: Never true  Ran Out of Food in the Last Year: Never true  Transportation Needs: No Transportation Needs (08/06/2022)   PRAPARE - Hydrologist (Medical): No    Lack of Transportation (Non-Medical): No  Physical Activity: Sufficiently Active (08/06/2022)   Exercise Vital Sign    Days of Exercise per Week: 6 days    Minutes of Exercise per Session: 90 min  Stress: No Stress Concern Present (08/06/2022)   [place]    Feeling of Stress : Not at all  Social Connections: [place] (08/06/2022)   Social Connection and Isolation Panel [NHANES]    Frequency of Communication with Friends and Family: More than three times a week    Frequency of Social Gatherings with Friends and Family: More than three times a week    Attends Religious Services: More than 4 times per year    Active Member of [employer] or Organizations: Yes    Attends Music therapist: More than 4 times per year    Marital Status: Married   Human resources officer Violence: Not At Risk (08/06/2022)   Humiliation, Afraid, Rape, and Kick questionnaire    Fear of Current or Ex-Partner: No    Emotionally Abused: No    Physically Abused: No    Sexually Abused: No    Review of Systems:  All other review of systems negative except as mentioned in the HPI.  Physical Exam: Vital signs BP 122/67   Pulse (!) 55   Temp (!) 96.8 F (36 C) (Temporal)   Ht '5\' 9"'$  (1.753 m)   Wt 189 lb 9.6 oz (86 kg)   SpO2 98%   BMI 28.00 kg/m   General:   Alert,  Well-developed, well-nourished, pleasant and cooperative in NAD Airway:  Mallampati 2 Lungs:  Clear throughout to auscultation.   Heart:  Regular rate and rhythm; no murmurs, clicks, rubs,  or gallops. Abdomen:  Soft, nontender and nondistended. Normal bowel sounds.   Neuro/Psych:  Normal mood and affect. A and O x 3   Sanaz Scarlett E. Candis Schatz, MD [facility]

## 2022-10-09 NOTE — Progress Notes (Signed)
A and O x3. Report to RN. Tolerated MAC anesthesia well. 

## 2022-10-09 NOTE — Progress Notes (Signed)
Called to room to assist during endoscopic procedure.  Patient ID and intended procedure confirmed with present staff. Received instructions for my participation in the procedure from the performing physician.  

## 2022-10-09 NOTE — Progress Notes (Signed)
Pt's states no medical or surgical changes since previsit or office visit. 

## 2022-10-09 NOTE — Op Note (Signed)
[facility] Patient Name: Christian Mcgrath Procedure Date: 10/09/2022 10:21 AM MRN: [id number] Endoscopist: Nicki Reaper E. Candis Schatz , MD, [id number] Age: 66 Referring MD:  Date of Birth: February 15, 1956 Gender: Male Account #: [id number] Procedure:                Colonoscopy Indications:              Screening for colorectal malignant neoplasm Medicines:                Monitored Anesthesia Care Procedure:                Pre-Anesthesia Assessment:                           - Prior to the procedure, a History and Physical                            was performed, and patient medications and                            allergies were reviewed. The patient's tolerance of                            previous anesthesia was also reviewed. The risks                            and benefits of the procedure and the sedation                            options and risks were discussed with the patient.                            All questions were answered, and informed consent                            was obtained. Prior Anticoagulants: The patient has                            taken no anticoagulant or antiplatelet agents. ASA                            Grade Assessment: II - A patient with mild systemic                            disease. After reviewing the risks and benefits,                            the patient was deemed in satisfactory condition to                            undergo the procedure.                           After obtaining informed consent, the colonoscope  was passed under direct vision. Throughout the                            procedure, the patient's blood pressure, pulse, and                            oxygen saturations were monitored continuously. The                            Olympus Scope [id number] was introduced through the                            anus and advanced to the the terminal ileum, with                             identification of the appendiceal orifice and IC                            valve. The colonoscopy was somewhat difficult due                            to significant looping. Successful completion of                            the procedure was aided by using manual pressure.                            The patient tolerated the procedure well. The                            quality of the bowel preparation was adequate. The                            terminal ileum, ileocecal valve, appendiceal                            orifice, and rectum were photographed. The bowel                            preparation used was SUPREP via split dose                            instruction. Scope In: 10:40:11 AM Scope Out: 11:10:18 AM Scope Withdrawal Time: 0 hours 22 minutes 56 seconds  Total Procedure Duration: 0 hours 30 minutes 7 seconds  Findings:                 The perianal and digital rectal examinations were                            normal. Pertinent negatives include normal                            sphincter tone and no palpable rectal lesions.  Three sessile polyps were found in the ascending                            colon. The polyps were 3 to 10 mm in size. These                            polyps were removed with a cold snare. Resection                            and retrieval were complete. Estimated blood loss                            was minimal. The pathology specimens were placed                            into Bottle Number 1.                           A 12 mm polyp was found in the transverse colon.                            The polyp was sessile. The polyp was removed with a                            cold snare. Resection and retrieval were complete.                            Estimated blood loss was minimal. The pathology                            specimen was placed into Bottle Number 2.                           Two sessile polyps were  found in the transverse                            colon. The polyps were 5 to 6 mm in size. These                            polyps were removed with a cold snare. Resection                            and retrieval were complete. Estimated blood loss                            was minimal. The pathology specimens were placed                            into Bottle Number 3.                           A 4 mm polyp was found in the sigmoid colon. The  polyp was sessile. The polyp was removed with a                            cold snare. Resection and retrieval were complete.                            The pathology specimen was placed into Bottle                            Number 4. Estimated blood loss was minimal.                           A 3 mm polyp was found in the rectum. The polyp was                            sessile. The polyp was removed with a cold snare.                            Resection and retrieval were complete. The                            pathology specimen was placed into Bottle Number 4.                            Estimated blood loss was minimal.                           The exam was otherwise normal throughout the                            examined colon. Complications:            No immediate complications. Estimated Blood Loss:     Estimated blood loss was minimal. Impression:               - Three 3 to 10 mm polyps in the ascending colon,                            removed with a cold snare. Resected and retrieved.                           - One 12 mm polyp in the transverse colon, removed                            with a cold snare. Resected and retrieved.                           - Two 5 to 6 mm polyps in the transverse colon,                            removed with a cold snare. Resected and retrieved.                           -  One 4 mm polyp in the sigmoid colon, removed with                            a cold snare. Resected  and retrieved.                           - One 3 mm polyp in the rectum, removed with a cold                            snare. Resected and retrieved. Recommendation:           - Patient has a contact number available for                            emergencies. The signs and symptoms of potential                            delayed complications were discussed with the                            patient. Return to normal activities tomorrow.                            Written discharge instructions were provided to the                            patient.                           - Resume previous diet.                           - Continue present medications.                           - Await pathology results.                           - Repeat colonoscopy (date not yet determined) for                            surveillance based on pathology results. Blaise Grieshaber E. Candis Schatz, MD 10/09/2022 11:23:19 AM This report has been signed electronically.

## 2022-10-12 ENCOUNTER — Telehealth: Payer: Self-pay | Admitting: *Deleted

## 2022-10-12 NOTE — Telephone Encounter (Signed)
  Follow up Call-     10/09/2022   10:01 AM  Call back number  Post procedure Call Back phone  # [phone]  Permission to leave phone message Yes     Patient questions:   Message left to call us if necessary.

## 2022-10-20 NOTE — Progress Notes (Signed)
Christian Mcgrath,   Seven of the eight polyps that I removed during your recent procedure were completely benign but were proven to be "pre-cancerous" polyps that MAY have grown into cancers if they had not been removed.  One polyp was a hyperplastic polyp which is not considered precancerous.  Studies shows that at least 20% of women over age 66 and 30% of men over age 68 have pre-cancerous polyps.  Based on current nationally recognized surveillance guidelines, I recommend that you have a repeat colonoscopy in 3 years.   If you develop any new rectal bleeding, abdominal pain or significant bowel habit changes, please contact me before then.

## 2023-02-08 ENCOUNTER — Encounter: Payer: Self-pay | Admitting: *Deleted

## 2023-06-04 ENCOUNTER — Ambulatory Visit (INDEPENDENT_AMBULATORY_CARE_PROVIDER_SITE_OTHER): Payer: Medicare Other | Admitting: Internal Medicine

## 2023-06-04 ENCOUNTER — Encounter: Payer: Self-pay | Admitting: Internal Medicine

## 2023-06-04 VITALS — BP 126/72 | HR 51 | Temp 97.6°F | Resp 16 | Ht 69.0 in | Wt 184.0 lb

## 2023-06-04 DIAGNOSIS — E785 Hyperlipidemia, unspecified: Secondary | ICD-10-CM

## 2023-06-04 DIAGNOSIS — I498 Other specified cardiac arrhythmias: Secondary | ICD-10-CM | POA: Diagnosis not present

## 2023-06-04 DIAGNOSIS — Z8042 Family history of malignant neoplasm of prostate: Secondary | ICD-10-CM

## 2023-06-04 DIAGNOSIS — E875 Hyperkalemia: Secondary | ICD-10-CM

## 2023-06-04 DIAGNOSIS — N401 Enlarged prostate with lower urinary tract symptoms: Secondary | ICD-10-CM

## 2023-06-04 LAB — COMPREHENSIVE METABOLIC PANEL
ALT: 13 U/L (ref 0–53)
AST: 23 U/L (ref 0–37)
Albumin: 4.3 g/dL (ref 3.5–5.2)
Alkaline Phosphatase: 76 U/L (ref 39–117)
BUN: 14 mg/dL (ref 6–23)
CO2: 31 mEq/L (ref 19–32)
Calcium: 9.7 mg/dL (ref 8.4–10.5)
Chloride: 100 mEq/L (ref 96–112)
Creatinine, Ser: 0.96 mg/dL (ref 0.40–1.50)
GFR: 81.99 mL/min (ref >60.00)
Glucose, Bld: 95 mg/dL (ref 70–99)
Potassium: 4.9 mEq/L (ref 3.5–5.1)
Sodium: 136 mEq/L (ref 135–145)
Total Bilirubin: 0.6 mg/dL (ref 0.2–1.2)
Total Protein: 6.5 g/dL (ref 6.0–8.3)

## 2023-06-04 LAB — LIPID PANEL
Cholesterol: 199 mg/dL (ref 0–200)
HDL: 42.5 mg/dL (ref >39.00)
LDL Cholesterol: 137 mg/dL — ABNORMAL HIGH (ref 0–99)
NonHDL: 156.18
Total CHOL/HDL Ratio: 5
Triglycerides: 96 mg/dL (ref 0.0–149.0)
VLDL: 19.2 mg/dL (ref 0.0–40.0)

## 2023-06-04 LAB — CBC WITH DIFFERENTIAL/PLATELET
Basophils Absolute: 0 10*3/uL (ref 0.0–0.1)
Basophils Relative: 1 % (ref 0.0–3.0)
Eosinophils Absolute: 0.2 10*3/uL (ref 0.0–0.7)
Eosinophils Relative: 4.1 % (ref 0.0–5.0)
HCT: 46.3 % (ref 39.0–52.0)
Hemoglobin: 15.1 g/dL (ref 13.0–17.0)
Lymphocytes Relative: 45.9 % (ref 12.0–46.0)
Lymphs Abs: 1.9 10*3/uL (ref 0.7–4.0)
MCHC: 32.7 g/dL (ref 30.0–36.0)
MCV: 93.5 fl (ref 78.0–100.0)
Monocytes Absolute: 0.4 10*3/uL (ref 0.1–1.0)
Monocytes Relative: 9.3 % (ref 3.0–12.0)
Neutro Abs: 1.7 10*3/uL (ref 1.4–7.7)
Neutrophils Relative %: 39.7 % — ABNORMAL LOW (ref 43.0–77.0)
Platelets: 211 10*3/uL (ref 150.0–400.0)
RBC: 4.94 Mil/uL (ref 4.22–5.81)
RDW: 13.6 % (ref 11.5–15.5)
WBC: 4.2 10*3/uL (ref 4.0–10.5)

## 2023-06-04 LAB — PSA: PSA: 2.73 ng/mL (ref 0.10–4.00)

## 2023-06-04 NOTE — Progress Notes (Unsigned)
   Subjective:    Patient ID: Christian Mcgrath, male    DOB: 06/11/1956, 67 y.o.   MRN: [id number]  DOS:  06/04/2023 Type of visit - description: Routine follow-up Chronic medical problems addressed, preventive care reviewed  He is active, does some running, goes to the gym.  Denies chest pain difficulty breathing or palpitations. No GI or GU symptoms   Review of Systems See above   Past Medical History:  Diagnosis Date   Abnormal ECG 08/04/2012   BCC (basal cell carcinoma of skin)    06-2010, sees derm   Colon polyps     Past Surgical History:  Procedure Laterality Date   COLONOSCOPY     HERNIA REPAIR     LEFT   KNEE ARTHROSCOPY     Left, age 89   VASECTOMY      Current Outpatient Medications  Medication Instructions   Ibuprofen (ADVIL PO) Oral, As needed       Objective:   Physical Exam BP 126/72 (BP Location: Right Arm, Patient Position: Sitting, Cuff Size: Small)   Pulse (!) 51   Temp 97.6 F (36.4 C) (Oral)   Resp 16   Ht 5\' 9"  (1.753 m)   Wt 184 lb (83.5 kg)   SpO2 100%   BMI 27.17 kg/m  General: Well developed, NAD, BMI noted Neck: No  thyromegaly  HEENT:  Normocephalic . Face symmetric, atraumatic Lungs:  CTA B Normal respiratory effort, no intercostal retractions, no accessory muscle use. Heart: RRR,  no murmur.  Abdomen:  Not distended, soft, non-tender. No rebound or rigidity.   DRE: Brown stool, normal sphincter tone, minimal enlargement of the prostate without nodularity. Lower extremities: no pretibial edema bilaterally  Skin: Exposed areas without rash. Not pale. Not jaundice Neurologic:  alert & oriented X3.  Speech normal, gait appropriate for age and unassisted Strength symmetric and appropriate for age.  Psych: Cognition and judgment appear intact.  Cooperative with normal attention span and concentration.  Behavior appropriate. No anxious or depressed appearing.     Assessment     Assessment : Dyslipidemia [facility] 2011 sees  dermatology Abnormal EKG 2013: Saw Dr. Graciela Husbands 2013, ? Brugadae, type II pattern. No symptoms, no further eval indicated , echo normal Neuropathy (blood work - 07-2022 per neurology)  PLAN: Hyperkalemia: Per chart review, check CMP Dyslipidemia: CVA risk increased to 15.6, patient aware, he qualifies for statin, checking labs. Brugada syndrome: Asymptomatic. Neuropathy: Saw neurology 07-2022 extensive blood work  >>> negative Preventative care reviewed  --T d 12-2014 - s/p shingrex x 2 - PNM 20 : 2022 - Vaccines I recommend: RSV. Covid booster. Flu shot  (fall) -- prostate ca screening: + FH prostate cancer, father age 90, no symptoms, DRE: Slightly increased prostate (BPH) check PSA -- CCS: Cscope-- 2-08, polyp;  Cscope  08-2012 (-), Cascope 09-2022 --Lifestyle:  runs, less than before , goes to gym  - POA  discussed RTC 6 m

## 2023-06-04 NOTE — Patient Instructions (Addendum)
This is a summary of the advice provided today.  Please read if you have questions about today's visit.  You can always call us back for more information.  If you have MyChart, please use it. Let the APP send you alerts and  reminders.   If you are not planning to use MyChart, please deactivate it.  If you have it and don't use it, you won't see my comments about your results.  If I order blood work, x-rays or referrals and you do not see your results or don't hear about the referrals within a week:  please call my office.   You qualify for cholesterol medication. You can assess your risk going to this website [email or web address]  Vaccines I recommend:   RSV. Covid booster. Flu shot  (fall)   GO TO THE LAB : Get the blood work     GO TO THE FRONT DESK, PLEASE SCHEDULE YOUR APPOINTMENTS Come back for a checkup in 6 months    "Health Care Power of attorney" ,  "Living will" (Advance care planning documents)  If you already have a living will or healthcare power of attorney, is recommended you bring the copy to be scanned in your chart.   The document will be available to all the doctors you see in the system.  Advance care planning is a process that supports adults in  understanding and sharing their preferences regarding future medical care.  The patient's preferences are recorded in documents called Advance Directives and the can be modified at any time while the patient is in full mental capacity.   If you don't have one, please consider create one.      More information at: [email or web address]

## 2023-06-06 ENCOUNTER — Encounter: Payer: Self-pay | Admitting: Internal Medicine

## 2023-06-06 NOTE — Assessment & Plan Note (Signed)
Preventative care reviewed  --T d 12-2014 - s/p shingrex x 2 - PNM 20 : 2022 - Vaccines I recommend: RSV. Covid booster. Flu shot  (fall) -- prostate ca screening: + FH prostate cancer, father age 67, no symptoms, DRE: Slightly increased prostate (BPH) check PSA -- CCS: Cscope-- 2-08, polyp;  Cscope  08-2012 (-), Cascope 09-2022 --Lifestyle:  runs, less than before , goes to gym  - POA  discussed

## 2023-06-06 NOTE — Assessment & Plan Note (Signed)
Hyperkalemia: Per chart review, check CMP Dyslipidemia: CVA risk increased to 15.6, patient aware, he qualifies for statin, checking labs. Brugada syndrome: Asymptomatic. Neuropathy: Saw neurology 07-2022 extensive blood work  >>> negative Preventative care reviewed  RTC 6 m

## 2023-06-07 MED ORDER — ATORVASTATIN CALCIUM 20 MG PO TABS: 20.0000 mg | ORAL_TABLET | Freq: Every day | ORAL | 0 refills | Status: DC

## 2023-06-07 NOTE — Addendum Note (Signed)
Addended byConrad [employer]land AF[employer] D on: 06/07/2023 07:59 AM   Modules accepted: Orders

## 2023-07-12 IMAGING — DX DG CERVICAL SPINE COMPLETE 4+V
5 series · 5 of 5 positions shown · non-contrast
Comparison: No prior.

CLINICAL DATA: Pain and numbness left arm.

EXAM:
CERVICAL SPINE - COMPLETE 4+ VIEW

[c-spine lat]
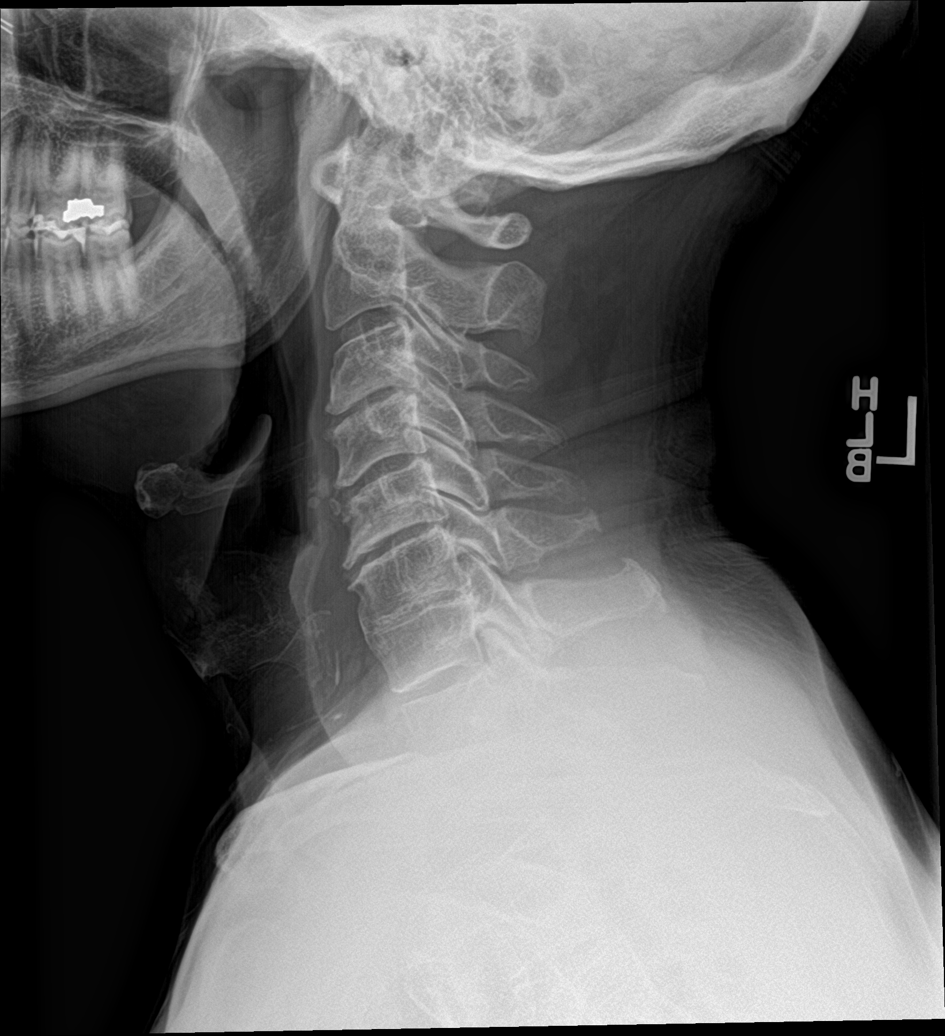

[c-spine obl (1 of 2)]
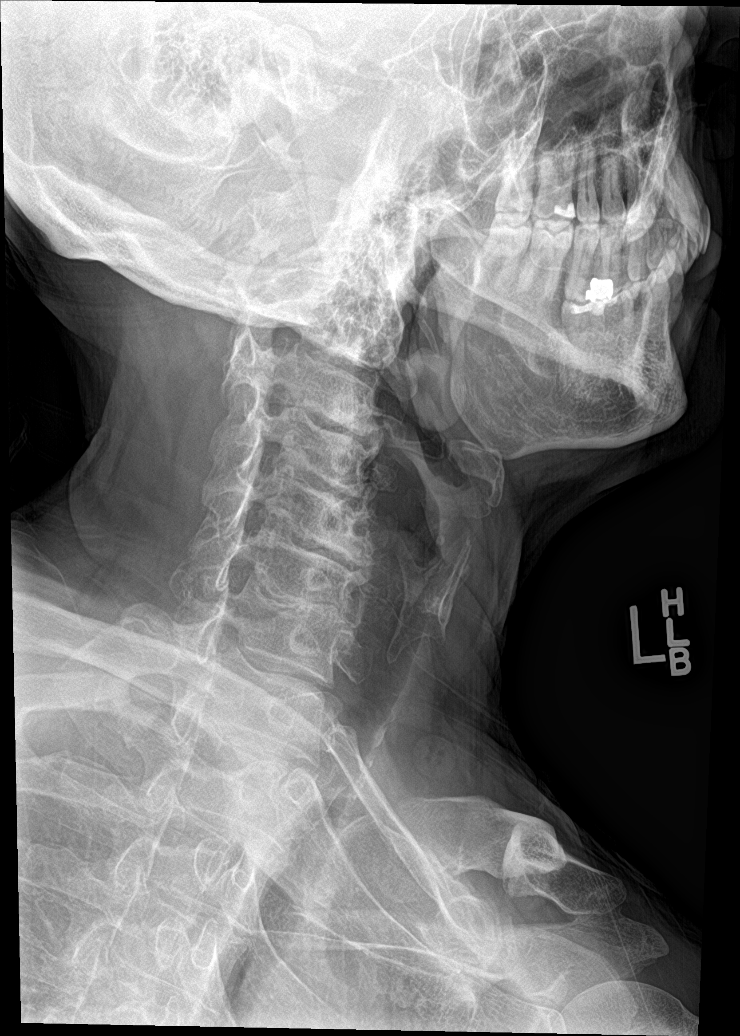

[c-spine obl (2 of 2)]
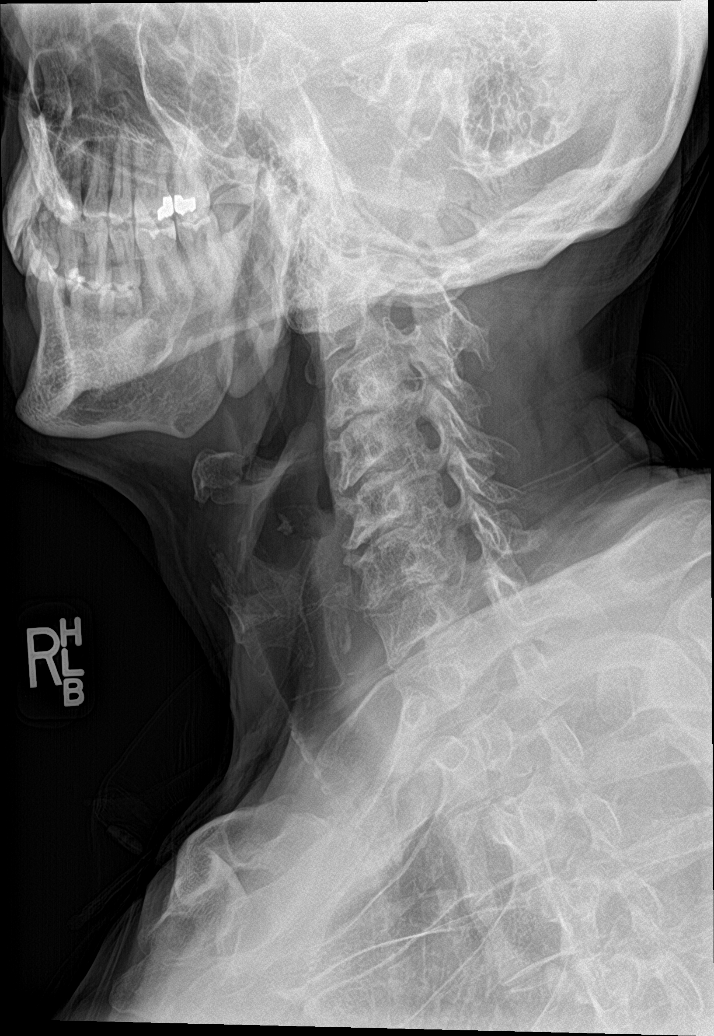

[c-spine ap]
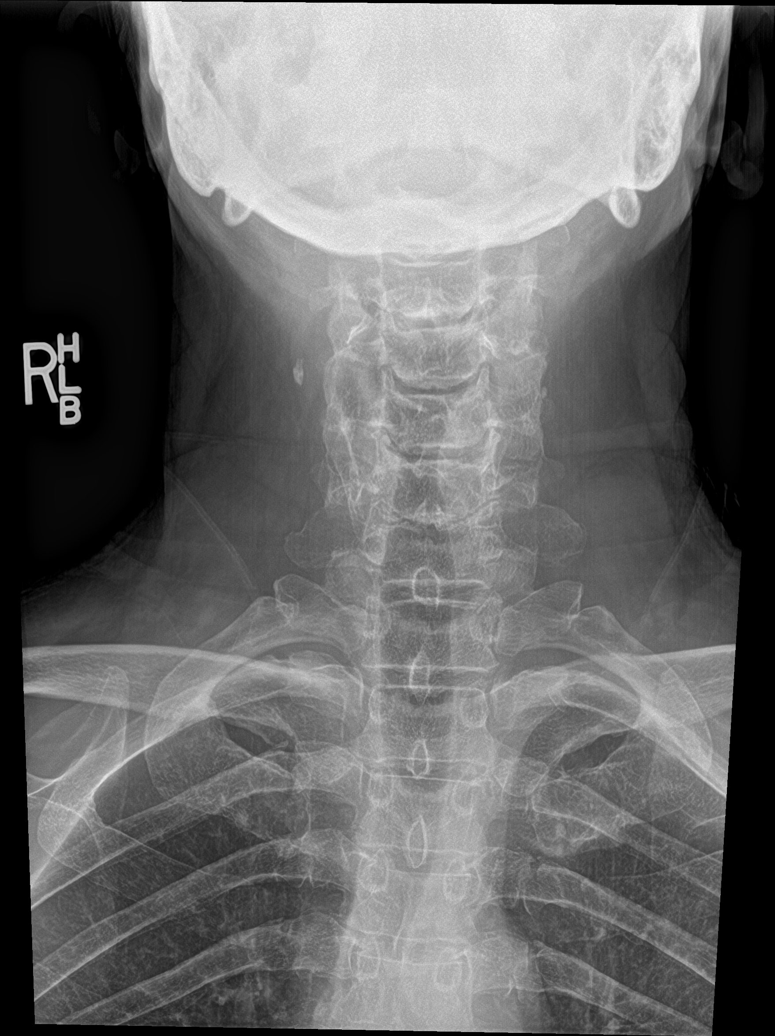

[c-spine open mouth]
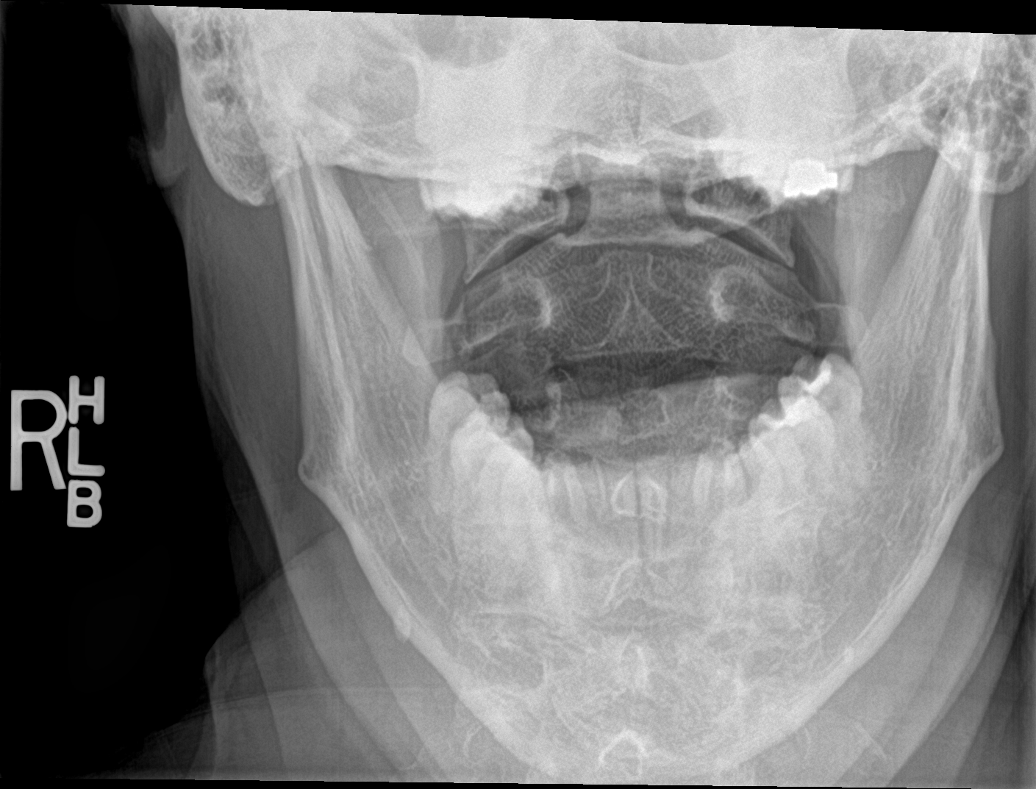

[5 of 5 positions shown; findings below may reference images not displayed]

FINDINGS: Diffuse osteopenia. Diffuse severe multilevel disc degeneration and
endplate osteophyte formation. Diffuse mild facet hypertrophy.
Narrowing of the left C3 neural foramen cannot be excluded. No acute
bony abnormality identified. Carotid vascular disease cannot be
excluded.
IMPRESSION: 1. Diffuse osteopenia. Diffuse severe degenerative changes with
severe multilevel disc degeneration and endplate osteophyte
formation. Diffuse mild facet hypertrophy. Mild narrowing of the
left C3 neural foramen cannot be excluded. No acute bony
abnormality.

2.  Carotid vascular disease cannot be excluded.

## 2023-09-05 ENCOUNTER — Other Ambulatory Visit: Payer: Self-pay | Admitting: Internal Medicine

## 2023-09-12 ENCOUNTER — Encounter: Payer: Self-pay | Admitting: Internal Medicine

## 2023-10-08 ENCOUNTER — Ambulatory Visit (INDEPENDENT_AMBULATORY_CARE_PROVIDER_SITE_OTHER): Payer: Medicare Other | Admitting: *Deleted

## 2023-10-08 DIAGNOSIS — Z0000 Encounter for general adult medical examination without abnormal findings: Secondary | ICD-10-CM

## 2023-10-08 NOTE — Patient Instructions (Signed)
Christian Mcgrath , Thank you for taking time to come for your Medicare Wellness Visit. I appreciate your ongoing commitment to your health goals. Please review the following plan we discussed and let me know if I can assist you in the future.     This is a list of the screening recommended for you and due dates:  Health Maintenance  Topic Date Due   COVID-19 Vaccine (5 - 2024-25 season) 06/27/2023   Flu Shot  01/24/2024*   Medicare Annual Wellness Visit  10/07/2024   DTaP/Tdap/Td vaccine (3 - Td or Tdap) 12/30/2024   Colon Cancer Screening  10/09/2025   Pneumonia Vaccine  Completed   Hepatitis C Screening  Completed   Zoster (Shingles) Vaccine  Completed   HPV Vaccine  Aged Out  *Topic was postponed. The date shown is not the original due date.    Next appointment: Follow up in one year for your annual wellness visit.   Preventive Care 26 Years and Older, Male Preventive care refers to lifestyle choices and visits with your health care provider that can promote health and wellness. What does preventive care include? A yearly physical exam. This is also called an annual well check. Dental exams once or twice a year. Routine eye exams. Ask your health care provider how often you should have your eyes checked. Personal lifestyle choices, including: Daily care of your teeth and gums. Regular physical activity. Eating a healthy diet. Avoiding tobacco and drug use. Limiting alcohol use. Practicing safe sex. Taking low doses of aspirin every day. Taking vitamin and mineral supplements as recommended by your health care provider. What happens during an annual well check? The services and screenings done by your health care provider during your annual well check will depend on your age, overall health, lifestyle risk factors, and family history of disease. Counseling  Your health care provider may ask you questions about your: Alcohol use. Tobacco use. Drug use. Emotional  well-being. Home and relationship well-being. Sexual activity. Eating habits. History of falls. Memory and ability to understand (cognition). Work and work Astronomer. Screening  You may have the following tests or measurements: Height, weight, and BMI. Blood pressure. Lipid and cholesterol levels. These may be checked every 5 years, or more frequently if you are over 43 years old. Skin check. Lung cancer screening. You may have this screening every year starting at age 67 if you have a 30-pack-year history of smoking and currently smoke or have quit within the past 15 years. Fecal occult blood test (FOBT) of the stool. You may have this test every year starting at age 67 Flexible sigmoidoscopy or colonoscopy. You may have a sigmoidoscopy every 5 years or a colonoscopy every 10 years starting at age 67 Prostate cancer screening. Recommendations will vary depending on your family history and other risks. Hepatitis C blood test. Hepatitis B blood test. Sexually transmitted disease (STD) testing. Diabetes screening. This is done by checking your blood sugar (glucose) after you have not eaten for a while (fasting). You may have this done every 1-3 years. Abdominal aortic aneurysm (AAA) screening. You may need this if you are a current or former smoker. Osteoporosis. You may be screened starting at age 67 if you are at high risk. Talk with your health care provider about your test results, treatment options, and if necessary, the need for more tests. Vaccines  Your health care provider may recommend certain vaccines, such as: Influenza vaccine. This is recommended every year. Tetanus, diphtheria, and acellular  pertussis (Tdap, Td) vaccine. You may need a Td booster every 10 years. Zoster vaccine. You may need this after age 67. Pneumococcal 13-valent conjugate (PCV13) vaccine. One dose is recommended after age 67. Pneumococcal polysaccharide (PPSV23) vaccine. One dose is recommended after  age 67. Talk to your health care provider about which screenings and vaccines you need and how often you need them. This information is not intended to replace advice given to you by your health care provider. Make sure you discuss any questions you have with your health care provider. Document Released: 11/08/2015 Document Revised: 07/01/2016 Document Reviewed: 08/13/2015 Elsevier Interactive Patient Education  2017 [employer].  Fall Prevention in the Home Falls can cause injuries. They can happen to people of all ages. There are many things you can do to make your home safe and to help prevent falls. What can I do on the outside of my home? Regularly fix the edges of walkways and driveways and fix any cracks. Remove anything that might make you trip as you walk through a door, such as a raised step or threshold. Trim any bushes or trees on the path to your home. Use bright outdoor lighting. Clear any walking paths of anything that might make someone trip, such as rocks or tools. Regularly check to see if handrails are loose or broken. Make sure that both sides of any steps have handrails. Any raised decks and porches should have guardrails on the edges. Have any leaves, snow, or ice cleared regularly. Use sand or salt on walking paths during winter. Clean up any spills in your garage right away. This includes oil or grease spills. What can I do in the bathroom? Use night lights. Install grab bars by the toilet and in the tub and shower. Do not use towel bars as grab bars. Use non-skid mats or decals in the tub or shower. If you need to sit down in the shower, use a plastic, non-slip stool. Keep the floor dry. Clean up any water that spills on the floor as soon as it happens. Remove soap buildup in the tub or shower regularly. Attach bath mats securely with double-sided non-slip rug tape. Do not have throw rugs and other things on the floor that can make you trip. What can I do in the  bedroom? Use night lights. Make sure that you have a light by your bed that is easy to reach. Do not use any sheets or blankets that are too big for your bed. They should not hang down onto the floor. Have a firm chair that has side arms. You can use this for support while you get dressed. Do not have throw rugs and other things on the floor that can make you trip. What can I do in the kitchen? Clean up any spills right away. Avoid walking on wet floors. Keep items that you use a lot in easy-to-reach places. If you need to reach something above you, use a strong step stool that has a grab bar. Keep electrical cords out of the way. Do not use floor polish or wax that makes floors slippery. If you must use wax, use non-skid floor wax. Do not have throw rugs and other things on the floor that can make you trip. What can I do with my stairs? Do not leave any items on the stairs. Make sure that there are handrails on both sides of the stairs and use them. Fix handrails that are broken or loose. Make sure that handrails  are as long as the stairways. Check any carpeting to make sure that it is firmly attached to the stairs. Fix any carpet that is loose or worn. Avoid having throw rugs at the top or bottom of the stairs. If you do have throw rugs, attach them to the floor with carpet tape. Make sure that you have a light switch at the top of the stairs and the bottom of the stairs. If you do not have them, ask someone to add them for you. What else can I do to help prevent falls? Wear shoes that: Do not have high heels. Have rubber bottoms. Are comfortable and fit you well. Are closed at the toe. Do not wear sandals. If you use a stepladder: Make sure that it is fully opened. Do not climb a closed stepladder. Make sure that both sides of the stepladder are locked into place. Ask someone to hold it for you, if possible. Clearly mark and make sure that you can see: Any grab bars or  handrails. First and last steps. Where the edge of each step is. Use tools that help you move around (mobility aids) if they are needed. These include: Canes. Walkers. Scooters. Crutches. Turn on the lights when you go into a dark area. Replace any light bulbs as soon as they burn out. Set up your furniture so you have a clear path. Avoid moving your furniture around. If any of your floors are uneven, fix them. If there are any pets around you, be aware of where they are. Review your medicines with your doctor. Some medicines can make you feel dizzy. This can increase your chance of falling. Ask your doctor what other things that you can do to help prevent falls. This information is not intended to replace advice given to you by your health care provider. Make sure you discuss any questions you have with your health care provider. Document Released: 08/08/2009 Document Revised: 03/19/2016 Document Reviewed: 11/16/2014 Elsevier Interactive Patient Education  2017 [employer].

## 2023-10-08 NOTE — Progress Notes (Signed)
Subjective:   Christian Mcgrath is a 67 y.o. male who presents for Medicare Annual/Subsequent preventive examination.  Visit Complete: Virtual I connected with  Particia Nearing on 10/08/23 by a audio enabled telemedicine application and verified that I am speaking with the correct person using two identifiers.  Patient Location: Home  Provider Location: Office/Clinic  I discussed the limitations of evaluation and management by telemedicine. The patient expressed understanding and agreed to proceed.  Vital Signs: Because this visit was a virtual/telehealth visit, some criteria may be missing or patient reported. Any vitals not documented were not able to be obtained and vitals that have been documented are patient reported.   Cardiac Risk Factors include: advanced age (>7men, >1 women);male gender;dyslipidemia     Objective:    There were no vitals filed for this visit. There is no height or weight on file to calculate BMI.     10/08/2023    1:14 PM 08/06/2022    1:59 PM  Advanced Directives  Does Patient Have a Medical Advance Directive? No No  Would patient like information on creating a medical advance directive? No - Patient declined No - Patient declined    Current Medications (verified) Outpatient Encounter Medications as of 10/08/2023  Medication Sig   atorvastatin (LIPITOR) 20 MG tablet Take 1 tablet (20 mg total) by mouth at bedtime.   Ibuprofen (ADVIL PO) Take by mouth as needed.   No facility-administered encounter medications on file as of 10/08/2023.    Allergies (verified) Penicillins   History: Past Medical History:  Diagnosis Date   Abnormal ECG 08/04/2012   BCC (basal cell carcinoma of skin)    06-2010, sees derm   Colon polyps    Past Surgical History:  Procedure Laterality Date   COLONOSCOPY     HERNIA REPAIR     LEFT   KNEE ARTHROSCOPY     Left, age 10   VASECTOMY     Family History  Problem Relation Age of Onset   Prostate  cancer Father        Dx age in early 16s   Cancer Father        oral, not a smoker    Coronary artery disease Neg Hx    Hypertension Neg Hx    Stroke Neg Hx    Diabetes Neg Hx    Colon cancer Neg Hx    Rectal cancer Neg Hx    Stomach cancer Neg Hx    Social History   Socioeconomic History   Marital status: Married    Spouse name: Not on file   Number of children: 3   Years of education: Not on file   Highest education level: Not on file  Occupational History   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    Employer: [employer]  Tobacco Use   Smoking status: Never   Smokeless tobacco: Never  Vaping Use   Vaping status: Never Used  Substance and Sexual Activity   Alcohol use: Yes    Alcohol/week: 4.0 standard drinks of alcohol    Types: 4 Cans of beer per week    Comment: 4 beers weekends   Drug use: No   Sexual activity: Not on file  Other Topics Concern   Not on file  Social History Narrative   Mother and father > 61 y/o, independent, live in [place]             Social Drivers of Corporate investment banker Strain: Low  Risk  (08/06/2022)   Overall Financial Resource Strain (CARDIA)    Difficulty of Paying Living Expenses: Not hard at all  Food Insecurity: No Food Insecurity (10/08/2023)   Hunger Vital Sign    Worried About Running Out of Food in the Last Year: Never true    Ran Out of Food in the Last Year: Never true  Transportation Needs: No Transportation Needs (10/08/2023)   PRAPARE - Administrator, Civil Service (Medical): No    Lack of Transportation (Non-Medical): No  Physical Activity: Sufficiently Active (10/08/2023)   Exercise Vital Sign    Days of Exercise per Week: 6 days    Minutes of Exercise per Session: 90 min  Stress: No Stress Concern Present (08/06/2022)   [employer] of Occupational Health - Occupational Stress Questionnaire    Feeling of Stress : Not at all  Social Connections: Socially Integrated (10/08/2023)   Social  Connection and Isolation Panel [NHANES]    Frequency of Communication with Friends and Family: More than three times a week    Frequency of Social Gatherings with Friends and Family: More than three times a week    Attends Religious Services: More than 4 times per year    Active Member of [employer] or Organizations: Yes    Attends Engineer, structural: More than 4 times per year    Marital Status: Married    Tobacco Counseling Counseling given: Not Answered   Clinical Intake:  Pre-visit preparation completed: Yes  Pain : No/denies pain  Nutritional Risks: None Diabetes: No  How often do you need to have someone help you when you read instructions, pamphlets, or other written materials from your doctor or pharmacy?: 1 - Never  Interpreter Needed?: No  Information entered by :: [employer], CMA   Activities of Daily Living    10/08/2023    1:02 PM  In your present state of health, do you have any difficulty performing the following activities:  Hearing? 0  Vision? 0  Comment wears readers  Difficulty concentrating or making decisions? 0  Walking or climbing stairs? 0  Dressing or bathing? 0  Doing errands, shopping? 0  Preparing Food and eating ? N  Using the Toilet? N  In the past six months, have you accidently leaked urine? N  Do you have problems with loss of bowel control? N  Managing your Medications? N  Managing your Finances? N  Housekeeping or managing your Housekeeping? N    Patient Care Team: Wanda Plump, MD as PCP - General  Indicate any recent Medical Services you may have received from other than [facility] providers in the past year (date may be approximate).     Assessment:   This is a routine wellness examination for Christian Mcgrath.  Hearing/Vision screen No results found.   Goals Addressed   None    Depression Screen    10/08/2023    1:14 PM 06/04/2023    8:47 AM 08/06/2022    2:00 PM 06/01/2022    9:56 AM 07/16/2021    3:26 PM 05/28/2021     1:09 PM 05/10/2020    9:24 AM  PHQ 2/9 Scores  PHQ - 2 Score 0 0 0 0 0 0 0  PHQ- 9 Score  0   0      Fall Risk    10/08/2023    1:07 PM 06/04/2023    8:47 AM 08/06/2022    1:59 PM 06/01/2022    9:55 AM 07/16/2021  3:26 PM  Fall Risk   Falls in the past year? 0 1 0 0 0  Number falls in past yr: 0 0 0 0 0  Injury with Fall? 0 0 0 0 0  Risk for fall due to : No Fall Risks No Fall Risks No Fall Risks  No Fall Risks  Follow up Falls evaluation completed Falls evaluation completed Falls evaluation completed Falls evaluation completed Falls evaluation completed    MEDICARE RISK AT HOME: Medicare Risk at Home Any stairs in or around the home?: Yes (2 steps into the home) If so, are there any without handrails?: No Home free of loose throw rugs in walkways, pet beds, electrical cords, etc?: No (has throw rugs) Adequate lighting in your home to reduce risk of falls?: Yes Life alert?: No Use of a cane, walker or w/c?: No Grab bars in the bathroom?: No Shower chair or bench in shower?: No Elevated toilet seat or a handicapped toilet?: No  TIMED UP AND GO:  Was the test performed?  No    Cognitive Function:        10/08/2023    1:14 PM 08/06/2022    2:05 PM  6CIT Screen  What Year? 0 points 0 points  What month? 0 points 0 points  What time? 0 points 0 points  Count back from 20 0 points 0 points  Months in reverse 0 points 0 points  Repeat phrase 0 points 2 points  Total Score 0 points 2 points    Immunizations Immunization History  Administered Date(s) Administered   Hep A / Hep B 01/29/2009, 02/28/2009, 04/06/2016   Influenza Whole 09/15/2007, 08/23/2009, 08/26/2013   Influenza,inj,Quad PF,6+ Mos 09/16/2018   Influenza-Unspecified 05/26/2014, 07/26/2020, 07/28/2021   PFIZER Comirnaty(Gray Top)Covid-19 Tri-Sucrose Vaccine 05/28/2021   PFIZER(Purple Top)SARS-COV-2 Vaccination 01/08/2020, 01/29/2020, 09/23/2020   PNEUMOCOCCAL CONJUGATE-20 05/28/2021   Td 09/20/2004    Tdap 12/31/2014   Zoster Recombinant(Shingrix) 09/16/2018, 11/10/2018    TDAP status: Up to date  Flu Vaccine status: Due, Education has been provided regarding the importance of this vaccine. Advised may receive this vaccine at local pharmacy or Health Dept. Aware to provide a copy of the vaccination record if obtained from local pharmacy or Health Dept. Verbalized acceptance and understanding.  Pneumococcal vaccine status: Up to date  Covid-19 vaccine status: Information provided on how to obtain vaccines.   Qualifies for Shingles Vaccine? Yes   Zostavax completed No   Shingrix Completed?: Yes  Screening Tests Health Maintenance  Topic Date Due   COVID-19 Vaccine (5 - 2024-25 season) 06/27/2023   Medicare Annual Wellness (AWV)  08/07/2023   INFLUENZA VACCINE  01/24/2024 (Originally 05/27/2023)   DTaP/Tdap/Td (3 - Td or Tdap) 12/30/2024   Colonoscopy  10/09/2025   Pneumonia Vaccine 88+ Years old  Completed   Hepatitis C Screening  Completed   Zoster Vaccines- Shingrix  Completed   HPV VACCINES  Aged Out    Health Maintenance  Health Maintenance Due  Topic Date Due   COVID-19 Vaccine (5 - 2024-25 season) 06/27/2023   Medicare Annual Wellness (AWV)  08/07/2023    Colorectal cancer screening: Type of screening: Colonoscopy. Completed 10/09/22. Repeat every 3 years  Lung Cancer Screening: (Low Dose CT Chest recommended if Age 39-80 years, 20 pack-year currently smoking OR have quit w/in 15years.) does not qualify.   Additional Screening:  Hepatitis C Screening: does qualify; Completed 04/26/17  Vision Screening: Recommended annual ophthalmology exams for early detection of glaucoma and other disorders of  the eye. Is the patient up to date with their annual eye exam?  No  Who is the provider or what is the name of the office in which the patient attends annual eye exams? [facility] If pt is not established with a provider, would they like to be referred to a provider to  establish care? No .   Dental Screening: Recommended annual dental exams for proper oral hygiene  Diabetic Foot Exam: N/a  Community Resource Referral / Chronic Care Management: CRR required this visit?  No   CCM required this visit?  No     Plan:     I have personally reviewed and noted the following in the patient's chart:   Medical and social history Use of alcohol, tobacco or illicit drugs  Current medications and supplements including opioid prescriptions. Patient is not currently taking opioid prescriptions. Functional ability and status Nutritional status Physical activity Advanced directives List of other physicians Hospitalizations, surgeries, and ER visits in previous 12 months Vitals Screenings to include cognitive, depression, and falls Referrals and appointments  In addition, I have reviewed and discussed with patient certain preventive protocols, quality metrics, and best practice recommendations. A written personalized care plan for preventive services as well as general preventive health recommendations were provided to patient.     Donne Anon, CMA   10/08/2023   After Visit Summary: (MyChart) Due to this being a telephonic visit, the after visit summary with patients personalized plan was offered to patient via MyChart   Nurse Notes: None

## 2023-10-11 ENCOUNTER — Ambulatory Visit (INDEPENDENT_AMBULATORY_CARE_PROVIDER_SITE_OTHER): Payer: Medicare Other | Admitting: Internal Medicine

## 2023-10-11 ENCOUNTER — Encounter: Payer: Self-pay | Admitting: Internal Medicine

## 2023-10-11 VITALS — BP 116/78 | HR 57 | Temp 97.8°F | Resp 16 | Ht 69.0 in | Wt 187.5 lb

## 2023-10-11 DIAGNOSIS — E785 Hyperlipidemia, unspecified: Secondary | ICD-10-CM

## 2023-10-11 NOTE — Patient Instructions (Addendum)
 Vaccines I recommend: Covid booster      GO TO THE LAB : Get the blood work     Next visit with me in 6 months    Please schedule it at the front desk

## 2023-10-11 NOTE — Progress Notes (Signed)
   Subjective:    Patient ID: Christian Mcgrath, male    DOB: 1956/07/05, 67 y.o.   MRN: [id number]  DOS:  10/11/2023 Type of visit - description: Routine checkup  Since the last visit, he started atorvastatin, he noticed that gradually started to feel poorly: Generalized aches and pains, fatigue. About 10 days ago he stopped atorvastatin and is definitely feeling better.  Denies chest pain or palpitations  Review of Systems See above   Past Medical History:  Diagnosis Date   Abnormal ECG 08/04/2012   BCC (basal cell carcinoma of skin)    06-2010, sees derm   Colon polyps     Past Surgical History:  Procedure Laterality Date   COLONOSCOPY     HERNIA REPAIR     LEFT   KNEE ARTHROSCOPY     Left, age 34   VASECTOMY      Current Outpatient Medications  Medication Instructions   atorvastatin (LIPITOR) 20 mg, Oral, Daily at bedtime   Ibuprofen (ADVIL PO) As needed       Objective:   Physical Exam BP 116/78   Pulse (!) 57   Temp 97.8 F (36.6 C) (Oral)   Resp 16   Ht 5\' 9"  (1.753 m)   Wt 187 lb 8 oz (85 kg)   SpO2 99%   BMI 27.69 kg/m  General:   Well developed, NAD, BMI noted. HEENT:  Normocephalic . Face symmetric, atraumatic Lungs:  CTA B Normal respiratory effort, no intercostal retractions, no accessory muscle use. Heart: RRR,  no murmur.  Lower extremities: no pretibial edema bilaterally  Skin: Not pale. Not jaundice Neurologic:  alert & oriented X3.  Speech normal, gait appropriate for age and unassisted Psych--  Cognition and judgment appear intact.  Cooperative with normal attention span and concentration.  Behavior appropriate. No anxious or depressed appearing.      Assessment     Assessment : Dyslipidemia [facility] 2011 sees dermatology Abnormal EKG 2013: Saw Dr. Graciela Husbands 2013, ? [place], type II pattern. No symptoms, no further eval indicated , echo normal Neuropathy (blood work - 07-2022 per neurology)  PLAN: Dyslipidemia: Last LDL 137,  cardiovascular risk 15.5%,  started  atorvastatin 20 mg.  He took it for a while, developed fatigue and generalized aches, stopped ~10 days ago he feels much better. He has a family history of statin intolerance (mother). Doing well with diet, he remains active running 2 miles twice a week and playing golf. Plan: Request FLP AST ALT, will do, further laboratory results, likely to try Crestor 5 mg 3 times a week. Vaccine advice: Had a flu shot, recommend to consider COVID-vaccine RTC 6 months.

## 2023-10-12 LAB — LIPID PANEL
Cholesterol: 186 mg/dL (ref 0–200)
HDL: 41.4 mg/dL (ref >39.00)
LDL Cholesterol: 119 mg/dL — ABNORMAL HIGH (ref 0–99)
NonHDL: 144.41
Total CHOL/HDL Ratio: 4
Triglycerides: 126 mg/dL (ref 0.0–149.0)
VLDL: 25.2 mg/dL (ref 0.0–40.0)

## 2023-10-12 LAB — AST: AST: 31 U/L (ref 0–37)

## 2023-10-12 LAB — ALT: ALT: 16 U/L (ref 0–53)

## 2023-10-12 NOTE — Assessment & Plan Note (Signed)
Dyslipidemia: Last LDL 137, cardiovascular risk 15.5%,  started  atorvastatin 20 mg.  He took it for a while, developed fatigue and generalized aches, stopped ~10 days ago he feels much better. He has a family history of statin intolerance (mother). Doing well with diet, he remains active running 2 miles twice a week and playing golf. Plan: Request FLP AST ALT, will do, further laboratory results, likely to try Crestor 5 mg 3 times a week. Vaccine advice: Had a flu shot, recommend to consider COVID-vaccine RTC 6 months.

## 2023-10-13 MED ORDER — ROSUVASTATIN CALCIUM 5 MG PO TABS: 5.0000 mg | ORAL_TABLET | ORAL | 5 refills | Status: DC

## 2023-10-13 NOTE — Addendum Note (Signed)
Addended byConrad Alpha D on: 10/13/2023 04:02 PM   Modules accepted: Orders

## 2023-11-06 ENCOUNTER — Emergency Department (HOSPITAL_BASED_OUTPATIENT_CLINIC_OR_DEPARTMENT_OTHER)
Admission: EM | Admit: 2023-11-06 | Discharge: 2023-11-06 | Disposition: A | Discharge status: Home / Self Care | Payer: Medicare Other | Attending: Emergency Medicine | Admitting: Emergency Medicine

## 2023-11-06 ENCOUNTER — Encounter (HOSPITAL_BASED_OUTPATIENT_CLINIC_OR_DEPARTMENT_OTHER): Payer: Self-pay

## 2023-11-06 ENCOUNTER — Other Ambulatory Visit: Payer: Self-pay

## 2023-11-06 ENCOUNTER — Emergency Department (HOSPITAL_BASED_OUTPATIENT_CLINIC_OR_DEPARTMENT_OTHER): Payer: Medicare Other

## 2023-11-06 DIAGNOSIS — R001 Bradycardia, unspecified: Secondary | ICD-10-CM | POA: Diagnosis not present

## 2023-11-06 DIAGNOSIS — M25511 Pain in right shoulder: Secondary | ICD-10-CM | POA: Insufficient documentation

## 2023-11-06 MED ORDER — OXYCODONE-ACETAMINOPHEN 5-325 MG PO TABS
1.0000 | ORAL_TABLET | Freq: Once | ORAL | Status: AC
  Administered 2023-11-06: 1 via ORAL
  Filled 2023-11-06: qty 1

## 2023-11-06 MED ORDER — LIDOCAINE 5 % EX PTCH
1.0000 | MEDICATED_PATCH | Freq: Once | CUTANEOUS | Status: DC
  Administered 2023-11-06: 1 via TRANSDERMAL
  Filled 2023-11-06: qty 1

## 2023-11-06 MED ORDER — ACETAMINOPHEN 325 MG PO TABS: 650.0000 mg | ORAL_TABLET | Freq: Four times a day (QID) | ORAL | 0 refills | Status: DC | PRN

## 2023-11-06 MED ORDER — IBUPROFEN 600 MG PO TABS: 600.0000 mg | ORAL_TABLET | Freq: Four times a day (QID) | ORAL | 0 refills | Status: DC | PRN

## 2023-11-06 MED ORDER — CYCLOBENZAPRINE HCL 10 MG PO TABS: 10.0000 mg | ORAL_TABLET | Freq: Two times a day (BID) | ORAL | 0 refills | Status: DC | PRN

## 2023-11-06 MED ORDER — KETOROLAC TROMETHAMINE 15 MG/ML IJ SOLN
15.0000 mg | Freq: Once | INTRAMUSCULAR | Status: AC
Start: 2023-11-06 — End: 2023-11-06
  Administered 2023-11-06: 15 mg via INTRAMUSCULAR
  Filled 2023-11-06: qty 1

## 2023-11-06 MED ORDER — OXYCODONE HCL 5 MG PO TABS: 5.0000 mg | ORAL_TABLET | ORAL | 0 refills | Status: DC | PRN

## 2023-11-06 NOTE — Discharge Instructions (Addendum)
 It was a pleasure caring for you today in the emergency department.  No heavy lifting next 7 days to right upper extremity.  Please follow-up with your PCP on Monday and if need to follow with orthopedics (Dr Barton)  Please return to the emergency department for any worsening or worrisome symptoms.

## 2023-11-06 NOTE — ED Triage Notes (Signed)
 The patient having right shoulder pain after swimming. No obvious injury.

## 2023-11-06 NOTE — ED Provider Notes (Signed)
 [place] [facility] Provider Note  CSN: [id number] Arrival date & time: 11/06/23 9197  Chief Complaint(s) Shoulder Pain  HPI Christian Mcgrath is a 68 y.o. male with past medical history as below, significant for HLD, cervical radiculopathy, Brugada, hemorrhoids who presents to the ED with complaint of shoulder pain  Pain ongoing over the past few days following exercise/swimming.  He had slight pain in her shoulder, muscle spasm type pain after swimming.  Pain progressively worsened, peaked overnight.  Pain primarily to the posterior aspect of his right shoulder.  There is like there is some mild radiation down to his elbow on the right side. Tylenol /Motrin , heat without much relief.  Similar pain in the past to the left side and received steroid injection which did help with the discomfort recently on the left shoulder/elbow.  No other pain or injuries, no complaints otherwise.  Was feeling well up until shoulder began hurting.  He is RHD  Past Medical History Past Medical History:  Diagnosis Date   Abnormal ECG 08/04/2012   BCC (basal cell carcinoma of skin)    06-2010, sees derm   Colon polyps    Patient Active Problem List   Diagnosis Date Noted   Dyslipidemia 06/04/2023   Basal cell carcinoma of back 12/11/2021   Basal cell carcinoma of face 12/11/2021   Ulnar neuropathy of left upper extremity 08/27/2021   Cervical radiculopathy 08/13/2021   PCP NOTES >>>> 09/27/2015   Hemorrhoids, Internal and external 01/29/2014   Brugada syndrome 08/04/2012   Annual physical exam 06/26/2011   Home Medication(s) Prior to Admission medications   Medication Sig Start Date End Date Taking? Authorizing Provider  acetaminophen  (TYLENOL ) 325 MG tablet Take 2 tablets (650 mg total) by mouth every 6 (six) hours as needed. 11/06/23  Yes Elnor Jayson LABOR, DO  cyclobenzaprine  (FLEXERIL ) 10 MG tablet Take 1 tablet (10 mg total) by mouth 2 (two) times daily as needed  for muscle spasms. 11/06/23  Yes Elnor Jayson A, DO  ibuprofen  (ADVIL ) 600 MG tablet Take 1 tablet (600 mg total) by mouth every 6 (six) hours as needed. 11/06/23  Yes Elnor Jayson A, DO  oxyCODONE  (ROXICODONE ) 5 MG immediate release tablet Take 1 tablet (5 mg total) by mouth every 4 (four) hours as needed. 11/06/23  Yes Elnor Jayson LABOR, DO  rosuvastatin  (CRESTOR ) 5 MG tablet Take 1 tablet (5 mg total) by mouth 3 (three) times a week. 10/13/23   Amon Aloysius BRAVO, MD                                                                                                                                    Past Surgical History Past Surgical History:  Procedure Laterality Date   COLONOSCOPY     HERNIA REPAIR     LEFT   KNEE ARTHROSCOPY     Left, age 36   VASECTOMY  Family History Family History  Problem Relation Age of Onset   Prostate cancer Father        Dx age in early 43s   Cancer Father        oral, not a smoker    Coronary artery disease Neg Hx    Hypertension Neg Hx    Stroke Neg Hx    Diabetes Neg Hx    Colon cancer Neg Hx    Rectal cancer Neg Hx    Stomach cancer Neg Hx     Social History Social History   Tobacco Use   Smoking status: Never   Smokeless tobacco: Never  Vaping Use   Vaping status: Never Used  Substance Use Topics   Alcohol use: Yes    Alcohol/week: 4.0 standard drinks of alcohol    Types: 4 Cans of beer per week    Comment: 4 beers weekends   Drug use: No   Allergies Penicillins  Review of Systems Review of Systems  Constitutional:  Negative for chills and fever.  Respiratory:  Negative for cough, chest tightness and shortness of breath.   Cardiovascular:  Negative for chest pain.  Gastrointestinal:  Negative for abdominal pain and vomiting.  Musculoskeletal:  Positive for arthralgias. Negative for neck pain.  Skin:  Negative for wound.  Neurological:  Negative for headaches.  All other systems reviewed and are negative.   Physical Exam Vital Signs   I have reviewed the triage vital signs BP (!) 146/80   Pulse (!) 51   Temp 97.7 F (36.5 C) (Oral)   Resp 18   Ht 5' 9 (1.753 m)   Wt 85 kg   SpO2 100%   BMI 27.67 kg/m  Physical Exam Vitals and nursing note reviewed.  Constitutional:      General: He is not in acute distress.    Appearance: Normal appearance. He is well-developed.  HENT:     Head: Normocephalic and atraumatic.     Right Ear: External ear normal.     Left Ear: External ear normal.     Mouth/Throat:     Mouth: Mucous membranes are moist.  Eyes:     General: No scleral icterus. Cardiovascular:     Rate and Rhythm: Regular rhythm. Bradycardia present.     Pulses: Normal pulses.     Heart sounds: Normal heart sounds.  Pulmonary:     Effort: Pulmonary effort is normal. No respiratory distress.     Breath sounds: Normal breath sounds.  Abdominal:     General: Abdomen is flat.     Palpations: Abdomen is soft.     Tenderness: There is no abdominal tenderness.  Musculoskeletal:       Arms:     Cervical back: No rigidity.     Right lower leg: No edema.     Left lower leg: No edema.     Comments: TTP to posterior aspect of right shoulder.  Right upper extremity is NVI.  He is Christian to actively lift his right upper extremity over his head.  No sig pain with Hawkins, some pain noted w/ neer's testing  Cap refill is brisk to r hand  Skin:    General: Skin is warm and dry.     Capillary Refill: Capillary refill takes less than 2 seconds.  Neurological:     Mental Status: He is alert.  Psychiatric:        Mood and Affect: Mood normal.        Behavior:  Behavior normal.     ED Results and Treatments Labs (all labs ordered are listed, but only abnormal results are displayed) Labs Reviewed - No data to display                                                                                                                        Radiology DG Shoulder Right Result Date: 11/06/2023 CLINICAL DATA:  Right  shoulder pain after swimming. EXAM: RIGHT SHOULDER - 2+ VIEW COMPARISON:  None Available. FINDINGS: There is no evidence of fracture or dislocation. There is no evidence of arthropathy or other focal bone abnormality. Soft tissues are unremarkable. IMPRESSION: Negative. Electronically Signed   By: Franky Crease M.D.   On: 11/06/2023 08:52    Pertinent labs & imaging results that were available during my care of the patient were reviewed by me and considered in my medical decision making (see MDM for details).  Medications Ordered in ED Medications  lidocaine  (LIDODERM ) 5 % 1 patch (1 patch Transdermal Patch Applied 11/06/23 0837)  oxyCODONE -acetaminophen  (PERCOCET/ROXICET) 5-325 MG per tablet 1 tablet (1 tablet Oral Given 11/06/23 0838)  ketorolac  (TORADOL ) 15 MG/ML injection 15 mg (15 mg Intramuscular Given 11/06/23 0840)                                                                                                                                     Procedures Procedures  (including critical care time)  Medical Decision Making / ED Course    Medical Decision Making:    ALRICK CUBBAGE is a 68 y.o. male  with past medical history as below, significant for HLD, cervical radiculopathy, Brugada, hemorrhoids who presents to the ED with complaint of shoulder pain. The complaint involves an extensive differential diagnosis and also carries with it a high risk of complications and morbidity.  Serious etiology was considered. Ddx includes but is not limited to: Sprain, strain, soft tissue injury, MSK injury, fracture, dislocation, neoplasm, etc.  Complete initial physical exam performed, notably the patient was in no distress, amatory steady gait.    Reviewed and confirmed nursing documentation for past medical history, family history, social history.  Vital signs reviewed.         Brief summary: 68 year old male with history as above here with right shoulder pain after swimming.  No other  complaints verbalized.  Unrelieved with home analgesics and heat.  Physical exam is  reassuring, pain was provoked with Neer's test.  Will get shoulder x-ray, sling, multimodal pain control, follow-up with orthopedics.  On recheck he is feeling better, x-ray reviewed and is unremarkable.  Encouraged outpatient supportive care, follow-up with PCP also follow-up orthopedics.  The patient improved significantly and was discharged in stable condition. Detailed discussions were had with the patient regarding current findings, and need for close f/u with PCP or on call doctor. The patient has been instructed to return immediately if the symptoms worsen in any way for re-evaluation. Patient verbalized understanding and is in agreement with current care plan. All questions answered prior to discharge.                  Additional history obtained: -Additional history obtained from spouse -External records from outside source obtained and reviewed including: Chart review including previous notes, labs, imaging, consultation notes including  Primary care documentation Home medications Prior imaging   Lab Tests: na  EKG   EKG Interpretation Date/Time:    Ventricular Rate:    PR Interval:    QRS Duration:    QT Interval:    QTC Calculation:   R Axis:      Text Interpretation:           Imaging Studies ordered: I ordered imaging studies including shoulder xr I independently visualized the following imaging with scope of interpretation limited to determining acute life threatening conditions related to emergency care; findings noted above I independently visualized and interpreted imaging. I agree with the radiologist interpretation   Medicines ordered and prescription drug management: Meds ordered this encounter  Medications   oxyCODONE -acetaminophen  (PERCOCET/ROXICET) 5-325 MG per tablet 1 tablet    Refill:  0   lidocaine  (LIDODERM ) 5 % 1 patch   ketorolac  (TORADOL )  15 MG/ML injection 15 mg   oxyCODONE  (ROXICODONE ) 5 MG immediate release tablet    Sig: Take 1 tablet (5 mg total) by mouth every 4 (four) hours as needed.    Dispense:  10 tablet    Refill:  0   acetaminophen  (TYLENOL ) 325 MG tablet    Sig: Take 2 tablets (650 mg total) by mouth every 6 (six) hours as needed.    Dispense:  36 tablet    Refill:  0   ibuprofen  (ADVIL ) 600 MG tablet    Sig: Take 1 tablet (600 mg total) by mouth every 6 (six) hours as needed.    Dispense:  30 tablet    Refill:  0   cyclobenzaprine  (FLEXERIL ) 10 MG tablet    Sig: Take 1 tablet (10 mg total) by mouth 2 (two) times daily as needed for muscle spasms.    Dispense:  20 tablet    Refill:  0    -I have reviewed the patients home medicines and have made adjustments as needed   Consultations Obtained: na   Cardiac Monitoring: Continuous pulse oximetry interpreted by myself, 100% on RA.    Social Determinants of Health:  Diagnosis or treatment significantly limited by social determinants of health: na   Reevaluation: After the interventions noted above, I reevaluated the patient and found that they have improved  Co morbidities that complicate the patient evaluation  Past Medical History:  Diagnosis Date   Abnormal ECG 08/04/2012   BCC (basal cell carcinoma of skin)    06-2010, sees derm   Colon polyps       Dispostion: Disposition decision including need for hospitalization was considered, and patient disposition pending at time of  sign out.    Final Clinical Impression(s) / ED Diagnoses Final diagnoses:  Acute pain of right shoulder        Elnor Jayson LABOR, DO 11/06/23 9077

## 2023-11-09 ENCOUNTER — Inpatient Hospital Stay: Payer: Medicare Other | Admitting: Family Medicine

## 2023-11-12 ENCOUNTER — Encounter: Payer: Self-pay | Admitting: Internal Medicine

## 2023-11-12 ENCOUNTER — Ambulatory Visit (INDEPENDENT_AMBULATORY_CARE_PROVIDER_SITE_OTHER): Payer: Medicare Other | Admitting: Internal Medicine

## 2023-11-12 VITALS — BP 108/60 | HR 59 | Temp 98.0°F | Resp 16 | Ht 69.0 in | Wt 189.5 lb

## 2023-11-12 DIAGNOSIS — M549 Dorsalgia, unspecified: Secondary | ICD-10-CM | POA: Diagnosis not present

## 2023-11-12 NOTE — Patient Instructions (Addendum)
Vaccines I recommend: Covid booster     Continue stretching    ICE or HEAT  Tylenol  500 mg OTC 2 tabs a day every 8 hours as needed for pain  IBUPROFEN (Advil or Motrin) 200 mg 2 tablets every 12 hours as needed for pain.  Always take it with food because may cause gastritis and ulcers.  If you notice nausea, stomach pain, change in the color of stools --->  Stop the medicine and let us know  Come back for blood work

## 2023-11-12 NOTE — Progress Notes (Signed)
   Subjective:    Patient ID: Christian Mcgrath, male    DOB: September 08, 1956, 68 y.o.   MRN: [id number]  DOS:  11/12/2023 Type of visit - description: ER f/u  Developed right trapezoid pain, it got gradually worse, eventually was so severe he went to the ER 11/06/2023: Right shoulder x-ray negative  Prior to the pain development, he did not have any fall, no fever or chills.  No neck pain per se.  No paresthesias. He did stretch his back and swim before the symptoms.  Review of Systems See above   Past Medical History:  Diagnosis Date   Abnormal ECG 08/04/2012   BCC (basal cell carcinoma of skin)    06-2010, sees derm   Colon polyps     Past Surgical History:  Procedure Laterality Date   COLONOSCOPY     HERNIA REPAIR     LEFT   KNEE ARTHROSCOPY     Left, age 73   VASECTOMY      Current Outpatient Medications  Medication Instructions   acetaminophen (TYLENOL) 650 mg, Oral, Every 6 hours PRN   cyclobenzaprine (FLEXERIL) 10 mg, Oral, 2 times daily PRN   ibuprofen (ADVIL) 600 mg, Oral, Every 6 hours PRN   oxyCODONE (ROXICODONE) 5 mg, Oral, Every 4 hours PRN   rosuvastatin (CRESTOR) 5 mg, Oral, 3 times weekly       Objective:   Physical Exam Skin:        BP 108/60   Pulse (!) 59   Temp 98 F (36.7 C) (Oral)   Resp 16   Ht 5\' 9"  (1.753 m)   Wt 189 lb 8 oz (86 kg)   SpO2 97%   BMI 27.98 kg/m  General:   Well developed, NAD, BMI noted. HEENT:  Normocephalic . Face symmetric, atraumatic MSK: Slightly TTP at the R trapezial area, see graphic. Shoulders: Symmetric, range of motion normal, no TTP at the [facility] joint. R arm: Reports pain at the medial  epicondyle which is indeed slightly TTP. Lower extremities: no pretibial edema bilaterally  Skin: Not pale. Not jaundice Neurologic:  alert & oriented X3.  Speech normal, gait appropriate for age and unassisted Psych--  Cognition and judgment appear intact.  Cooperative with normal attention span and concentration.   Behavior appropriate. No anxious or depressed appearing.      Assessment    Problem list Dyslipidemia [facility] 2011 sees dermatology Abnormal EKG 2013: Saw Dr. Graciela Husbands 2013, ? Brugadae, type II pattern. No symptoms, no further eval indicated , echo normal Neuropathy (blood work - 07-2022 per neurology)  PLAN: Right trapezoid area: Developed several days ago, pain got severe, went to  the ER, x-ray of the right shoulder negative.  Currently doing better.  Recommend to continue gentle stretching, exercising, Tylenol as needed, if needs more medication okay ibuprofen with GI precautions. Heat or ice. Call if not better. Epicondylitis: Mild discomfort at the R medial epicondyles , unrelated to above pain, denies finger paresthesias.  Observation

## 2023-11-12 NOTE — Assessment & Plan Note (Signed)
Right trapezoid area: Developed several days ago, pain got severe, went to  the ER, x-ray of the right shoulder negative.  Currently doing better.  Recommend to continue gentle stretching, exercising, Tylenol as needed, if needs more medication okay ibuprofen with GI precautions. Heat or ice. Call if not better. Epicondylitis: Mild discomfort at the R medial epicondyles , unrelated to above pain, denies finger paresthesias.  Observation

## 2023-11-22 DIAGNOSIS — G5621 Lesion of ulnar nerve, right upper limb: Secondary | ICD-10-CM | POA: Insufficient documentation

## 2023-12-06 ENCOUNTER — Ambulatory Visit: Payer: Medicare Other | Admitting: Internal Medicine

## 2023-12-06 ENCOUNTER — Encounter: Payer: Self-pay | Admitting: Internal Medicine

## 2023-12-06 VITALS — BP 122/66 | HR 76 | Temp 97.6°F | Resp 16 | Ht 69.0 in | Wt 193.4 lb

## 2023-12-06 DIAGNOSIS — E785 Hyperlipidemia, unspecified: Secondary | ICD-10-CM

## 2023-12-06 LAB — LIPID PANEL
Cholesterol: 162 mg/dL (ref 0–200)
HDL: 55.2 mg/dL (ref >39.00)
LDL Cholesterol: 90 mg/dL (ref 0–99)
NonHDL: 107.22
Total CHOL/HDL Ratio: 3
Triglycerides: 87 mg/dL (ref 0.0–149.0)
VLDL: 17.4 mg/dL (ref 0.0–40.0)

## 2023-12-06 LAB — AST: AST: 26 U/L (ref 0–37)

## 2023-12-06 LAB — ALT: ALT: 19 U/L (ref 0–53)

## 2023-12-06 NOTE — Progress Notes (Signed)
   Subjective:    Patient ID: Christian Mcgrath, male    DOB: [birth year], 68 y.o.   MRN: [id number]  DOS:  12/06/2023 Type of visit - description: Follow-up  Follow-up from previous visit. MSK symptoms improved. Has not been exercising as much but is getting back into a routine. Was Rx Crestor , good compliance and tolerance, does not feel any MSK side effects.  Wt Readings from Last 3 Encounters:  12/06/23 193 lb 6 oz (87.7 kg)  11/12/23 189 lb 8 oz (86 kg)  11/06/23 187 lb 6.3 oz (85 kg)     Review of Systems See above   Past Medical History:  Diagnosis Date   Abnormal ECG 08/04/2012   BCC (basal cell carcinoma of skin)    06-2010, sees derm   Colon polyps     Past Surgical History:  Procedure Laterality Date   COLONOSCOPY     HERNIA REPAIR     LEFT   KNEE ARTHROSCOPY     Left, age 33   VASECTOMY      Current Outpatient Medications  Medication Instructions   acetaminophen  (TYLENOL ) 650 mg, Oral, Every 6 hours PRN   cyclobenzaprine  (FLEXERIL ) 10 mg, Oral, 2 times daily PRN   ibuprofen  (ADVIL ) 600 mg, Oral, Every 6 hours PRN   oxyCODONE  (ROXICODONE ) 5 mg, Oral, Every 4 hours PRN   rosuvastatin  (CRESTOR ) 5 mg, Oral, 3 times weekly       Objective:   Physical Exam BP 122/66   Pulse 76   Temp 97.6 F (36.4 C) (Oral)   Resp 16   Ht 5\' 9"  (1.753 m)   Wt 193 lb 6 oz (87.7 kg)   SpO2 97%   BMI 28.56 kg/m  General:   Well developed, NAD, BMI noted. HEENT:  Normocephalic . Face symmetric, atraumatic Lungs:  CTA B Normal respiratory effort, no intercostal retractions, no accessory muscle use. Heart: RRR,  no murmur.  Lower extremities: no pretibial edema bilaterally  Skin: Not pale. Not jaundice Neurologic:  alert & oriented X3.  Speech normal, gait appropriate for age and unassisted Psych--  Cognition and judgment appear intact.  Cooperative with normal attention span and concentration.  Behavior appropriate. No anxious or depressed appearing.       Assessment     Problem list Dyslipidemia [facility] 2011 sees dermatology Abnormal EKG 2013: Saw Dr. Rodolfo Clan 2013, ? Brugadae, type II pattern. No symptoms, no further eval indicated , echo normal Neuropathy (blood work - 07-2022 per neurology)  PLAN: High cholesterol: LDL was 119 (October 11, 2023), reported atorvastatin  intolerance thus switch to Crestor  5 mg 3 times a week.  Good compliance,  no s/e. Again discussed the role of genetics, diet and exercise on his cholesterol levels.  Will check a FLP AST ALT.  Consider increase Crestor  to 10 mg 3 times a week, patient would be in favor of that.    Preventive care: Reports he had a flu shot RTC 05-2024

## 2023-12-06 NOTE — Patient Instructions (Addendum)
   GO TO THE LAB : Get the blood work     Next visit with me by 05-2024 for a checkup Please schedule it at the front desk

## 2023-12-06 NOTE — Assessment & Plan Note (Signed)
 High cholesterol: LDL was 119 (October 11, 2023), reported atorvastatin  intolerance thus switch to Crestor  5 mg 3 times a week.  Good compliance,  no s/e. Again discussed the role of genetics, diet and exercise on his cholesterol levels.  Will check a FLP AST ALT.  Consider increase Crestor  to 10 mg 3 times a week, patient would be in favor of that.    Preventive care: Reports he had a flu shot RTC 05-2024

## 2023-12-07 ENCOUNTER — Encounter: Payer: Self-pay | Admitting: Internal Medicine

## 2023-12-07 MED ORDER — ROSUVASTATIN CALCIUM 10 MG PO TABS: 10.0000 mg | ORAL_TABLET | ORAL | 1 refills | Status: DC

## 2023-12-07 NOTE — Addendum Note (Signed)
Addended byConrad Fraser D on: 12/07/2023 12:24 PM   Modules accepted: Orders

## 2024-05-12 HISTORY — PX: ANTERIOR FUSION CERVICAL SPINE: SUR626

## 2024-05-27 ENCOUNTER — Other Ambulatory Visit: Payer: Self-pay | Admitting: Internal Medicine

## 2024-06-05 ENCOUNTER — Encounter: Payer: Self-pay | Admitting: Internal Medicine

## 2024-06-05 ENCOUNTER — Ambulatory Visit (INDEPENDENT_AMBULATORY_CARE_PROVIDER_SITE_OTHER): Payer: Medicare Other | Admitting: Internal Medicine

## 2024-06-05 VITALS — BP 130/68 | HR 58 | Temp 98.0°F | Resp 12 | Ht 69.0 in | Wt 183.0 lb

## 2024-06-05 DIAGNOSIS — Z8042 Family history of malignant neoplasm of prostate: Secondary | ICD-10-CM | POA: Diagnosis not present

## 2024-06-05 DIAGNOSIS — E785 Hyperlipidemia, unspecified: Secondary | ICD-10-CM

## 2024-06-05 DIAGNOSIS — N401 Enlarged prostate with lower urinary tract symptoms: Secondary | ICD-10-CM

## 2024-06-05 DIAGNOSIS — N400 Enlarged prostate without lower urinary tract symptoms: Secondary | ICD-10-CM | POA: Diagnosis not present

## 2024-06-05 LAB — CBC WITH DIFFERENTIAL/PLATELET
Basophils Absolute: 0 K/uL (ref 0.0–0.1)
Basophils Relative: 0.8 % (ref 0.0–3.0)
Eosinophils Absolute: 0.1 K/uL (ref 0.0–0.7)
Eosinophils Relative: 2 % (ref 0.0–5.0)
HCT: 44.2 % (ref 39.0–52.0)
Hemoglobin: 14.8 g/dL (ref 13.0–17.0)
Lymphocytes Relative: 42.3 % (ref 12.0–46.0)
Lymphs Abs: 1.8 K/uL (ref 0.7–4.0)
MCHC: 33.5 g/dL (ref 30.0–36.0)
MCV: 91.1 fl (ref 78.0–100.0)
Monocytes Absolute: 0.3 K/uL (ref 0.1–1.0)
Monocytes Relative: 7.8 % (ref 3.0–12.0)
Neutro Abs: 2 K/uL (ref 1.4–7.7)
Neutrophils Relative %: 47.1 % (ref 43.0–77.0)
Platelets: 214 K/uL (ref 150.0–400.0)
RBC: 4.85 Mil/uL (ref 4.22–5.81)
RDW: 13.6 % (ref 11.5–15.5)
WBC: 4.3 K/uL (ref 4.0–10.5)

## 2024-06-05 LAB — BASIC METABOLIC PANEL WITH GFR
BUN: 13 mg/dL (ref 6–23)
CO2: 30 meq/L (ref 19–32)
Calcium: 9.3 mg/dL (ref 8.4–10.5)
Chloride: 103 meq/L (ref 96–112)
Creatinine, Ser: 0.87 mg/dL (ref 0.40–1.50)
GFR: 88.88 mL/min (ref >60.00)
Glucose, Bld: 92 mg/dL (ref 70–99)
Potassium: 4.5 meq/L (ref 3.5–5.1)
Sodium: 140 meq/L (ref 135–145)

## 2024-06-05 LAB — PSA: PSA: 2.61 ng/mL (ref 0.10–4.00)

## 2024-06-05 MED ORDER — ROSUVASTATIN CALCIUM 10 MG PO TABS: 10.0000 mg | ORAL_TABLET | ORAL | 3 refills | Status: AC

## 2024-06-05 NOTE — Assessment & Plan Note (Signed)
 High cholesterol: Last LDL improved at 90, cardiovascular risk still is slightly elevated at 13.7% thus was rec to increase dose, however he still finishing the supply of rosuvastatin  5 mg.  Plan: Switch to rosuvastatin  10 mg 3 times a week.  Prescription sent, recheck on RTC. Neck surgery: Had cervical radiculopathy, had surgery a couple of months ago, recuperating well. Preventive care care: Recommend a flu shot and a COVID booster this fall Prostate cancer screening: + FH, father in his 48s, no symptoms, check PSA RTC 4 months

## 2024-06-05 NOTE — Patient Instructions (Addendum)
 Once you finish  rosuvastatin  5 mg tablets , switch to rosuvastatin  10 mg 1 tablet 3 times a week.  Vaccines I recommend: Flu shot this fall Consider COVID booster  Next office visit for a check up in 4 months  Please make an appointment before you leave today

## 2024-06-05 NOTE — Progress Notes (Signed)
   Subjective:    Patient ID: Christian Mcgrath, male    DOB: [birth year], 68 y.o.   MRN: [id number]  DOS:  06/05/2024 Type of visit - description: Routine checkup  Since the last visit, had neck surgery.  Recuperating well. Other than that feels well. Chronic medical problems addressed. Denies any LUTS.   Review of Systems See above   Past Medical History:  Diagnosis Date   Abnormal ECG 08/04/2012   BCC (basal cell carcinoma of skin)    06-2010, sees derm   Colon polyps     Past Surgical History:  Procedure Laterality Date   COLONOSCOPY     HERNIA REPAIR     LEFT   KNEE ARTHROSCOPY     Left, age 43   VASECTOMY      Current Outpatient Medications  Medication Instructions   rosuvastatin  (CRESTOR ) 10 MG tablet TAKE 1 TABLET BY MOUTH 3 TIMES A WEEK.       Objective:   Physical Exam BP 130/68 (BP Location: Right Arm, Patient Position: Sitting, Cuff Size: Normal)   Pulse (!) 58   Temp 98 F (36.7 C) (Oral)   Resp 12   Ht 5' 9 (1.753 m)   Wt 183 lb (83 kg)   SpO2 97%   BMI 27.02 kg/m  General:   Well developed, NAD, BMI noted. HEENT:  Normocephalic . Face symmetric, atraumatic Lungs:  CTA B Normal respiratory effort, no intercostal retractions, no accessory muscle use. Heart: RRR,  no murmur.  Lower extremities: no pretibial edema bilaterally  Skin: Not pale. Not jaundice Neurologic:  alert & oriented X3.  Speech normal, gait appropriate for age and unassisted Psych--  Cognition and judgment appear intact.  Cooperative with normal attention span and concentration.  Behavior appropriate. No anxious or depressed appearing.      Assessment     Problem list Dyslipidemia [facility] 2011 sees dermatology Abnormal EKG 2013: Saw Dr. Fernande 2013, ? Brugadae, type II pattern. No symptoms, no further eval indicated , echo normal Neuropathy (blood work - 07-2022 per neurology)  PLAN: High cholesterol: Last LDL improved at 90, cardiovascular risk still is slightly  elevated at 13.7% thus was rec to increase dose, however he still finishing the supply of rosuvastatin  5 mg.  Plan: Switch to rosuvastatin  10 mg 3 times a week.  Prescription sent, recheck on RTC. Neck surgery: Had cervical radiculopathy, had surgery a couple of months ago, recuperating well. Preventive care care: Recommend a flu shot and a COVID booster this fall Prostate cancer screening: + FH, father in his 28s, no symptoms, check PSA RTC 4 months

## 2024-06-07 ENCOUNTER — Ambulatory Visit: Payer: Self-pay | Admitting: Internal Medicine

## 2024-10-09 ENCOUNTER — Ambulatory Visit: Admitting: Internal Medicine

## 2024-10-10 ENCOUNTER — Ambulatory Visit: Payer: Medicare Other

## 2024-10-10 VITALS — Ht 69.0 in | Wt 183.0 lb

## 2024-10-10 DIAGNOSIS — Z0000 Encounter for general adult medical examination without abnormal findings: Secondary | ICD-10-CM

## 2024-10-10 NOTE — Progress Notes (Signed)
 Please attest this visit in the absence of patient primary care provider.   Chief Complaint  Patient presents with   Medicare Wellness     Subjective:   Christian Mcgrath is a 68 y.o. male who presents for a Medicare Annual Wellness Visit.  Visit info / Clinical Intake: Medicare Wellness Visit Type:: Subsequent Annual Wellness Visit Persons participating in visit and providing information:: patient Medicare Wellness Visit Mode:: Telephone If telephone:: video declined Since this visit was completed virtually, some vitals may be partially provided or unavailable. Missing vitals are due to the limitations of the virtual format.: Unable to obtain vitals - no equipment If Telephone or Video please confirm:: I connected with patient using audio/video enable telemedicine. I verified patient identity with two identifiers, discussed telehealth limitations, and patient agreed to proceed. Patient Location:: home Provider Location:: office Interpreter Needed?: No Pre-visit prep was completed: yes AWV questionnaire completed by patient prior to visit?: no Living arrangements:: lives with spouse/significant other; with family/others (daughter and 2 grandchildren) Patient's Overall Health Status Rating: very good Typical amount of pain: none Does pain affect daily life?: no Are you currently prescribed opioids?: no  Dietary Habits and Nutritional Risks How many meals a day?: 3 Eats fruit and vegetables daily?: yes Most meals are obtained by: preparing own meals In the last 2 weeks, have you had any of the following?: none Diabetic:: no  Functional Status Activities of Daily Living (to include ambulation/medication): Independent Ambulation: Independent Medication Administration: Independent Home Management (perform basic housework or laundry): Independent Manage your own finances?: yes Primary transportation is: driving Concerns about vision?: no *vision screening is required for WTM*  (Up to date with a new group in the [place] area but can't remember name) Concerns about hearing?: no  Fall Screening Falls in the past year?: 0 Number of falls in past year: 0 Was there an injury with Fall?: 0 Fall Risk Category Calculator: 0 Patient Fall Risk Level: Low Fall Risk  Fall Risk Patient at Risk for Falls Due to: Orthopedic patient Fall risk Follow up: Falls evaluation completed  Home and Transportation Safety: All rugs have non-skid backing?: yes All stairs or steps have railings?: (!) no (has 3 steps into home w/o handrail) Grab bars in the bathtub or shower?: (!) no Have non-skid surface in bathtub or shower?: yes Good home lighting?: yes Regular seat belt use?: yes Hospital stays in the last year:: no  Cognitive Assessment Difficulty concentrating, remembering, or making decisions? : no Will 6CIT or Mini Cog be Completed: yes What year is it?: 0 points What month is it?: 0 points Give patient an address phrase to remember (5 components): [address] [place]  About what time is it?: 0 points Count backwards from 20 to 1: 0 points Say the months of the year in reverse: 0 points Repeat the address phrase from earlier: 2 points 6 CIT Score: 2 points  Advance Directives (For Healthcare) Does Patient Have a Medical Advance Directive?: No Would patient like information on creating a medical advance directive?: Yes (MAU/Ambulatory/Procedural Areas - Information given)  Reviewed/Updated  Reviewed/Updated: Reviewed All (Medical, Surgical, Family, Medications, Allergies, Care Teams, Patient Goals)    Allergies (verified) Penicillins   Current Medications (verified) Outpatient Encounter Medications as of 10/10/2024  Medication Sig   rosuvastatin  (CRESTOR ) 10 MG tablet Take 1 tablet (10 mg total) by mouth as directed. 1 tablet 3 times a week   No facility-administered encounter medications on file as of 10/10/2024.  History: Past Medical  History:  Diagnosis Date   Abnormal ECG 08/04/2012   BCC (basal cell carcinoma of skin)    06-2010, sees derm   Colon polyps    Past Surgical History:  Procedure Laterality Date   ANTERIOR FUSION CERVICAL SPINE  05/12/2024   Dr Onetha   COLONOSCOPY     HERNIA REPAIR     LEFT   KNEE ARTHROSCOPY     Left, age 20   VASECTOMY     Family History  Problem Relation Age of Onset   Prostate cancer Father        Dx age in early 15s   Cancer Father        oral, not a smoker    Coronary artery disease Neg Hx    Hypertension Neg Hx    Stroke Neg Hx    Diabetes Neg Hx    Colon cancer Neg Hx    Rectal cancer Neg Hx    Stomach cancer Neg Hx    Social History   Occupational History   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    Employer: [employer]  Tobacco Use   Smoking status: Never   Smokeless tobacco: Never  Vaping Use   Vaping status: Never Used  Substance and Sexual Activity   Alcohol use: Not Currently    Comment: 1 beer  on weekend   Drug use: No   Sexual activity: Not on file   Tobacco Counseling Counseling given: Not Answered  SDOH Screenings   Food Insecurity: No Food Insecurity (10/10/2024)  Housing: Low Risk (10/10/2024)  Transportation Needs: No Transportation Needs (10/10/2024)  Utilities: Not At Risk (10/10/2024)  Alcohol Screen: Low Risk (10/08/2023)  Depression (PHQ2-9): Low Risk (10/10/2024)  Financial Resource Strain: Low Risk (08/06/2022)  Physical Activity: Sufficiently Active (10/10/2024)  Social Connections: Socially Integrated (10/10/2024)  Stress: No Stress Concern Present (10/10/2024)  Tobacco Use: Low Risk (10/10/2024)  Health Literacy: Adequate Health Literacy (10/08/2023)   See flowsheets for full screening details  Depression Screen PHQ 2 & 9 Depression Scale- Over the past 2 weeks, how often have you been bothered by any of the following problems? Little interest or pleasure in doing things: 0 Feeling down, depressed, or hopeless (PHQ  Adolescent also includes...irritable): 1 (family concerns) PHQ-2 Total Score: 1 Trouble falling or staying asleep, or sleeping too much: 0 Feeling tired or having little energy: 0 Poor appetite or overeating (PHQ Adolescent also includes...weight loss): 0 Feeling bad about yourself - or that you are a failure or have let yourself or your family down: 0 Trouble concentrating on things, such as reading the newspaper or watching television (PHQ Adolescent also includes...like school work): 0 Moving or speaking so slowly that other people could have noticed. Or the opposite - being so fidgety or restless that you have been moving around a lot more than usual: 0 Thoughts that you would be better off dead, or of hurting yourself in some way: 0 PHQ-9 Total Score: 1 If you checked off any problems, how difficult have these problems made it for you to do your work, take care of things at home, or get along with other people?: Not difficult at all  Depression Treatment Depression Interventions/Treatment : EYV7-0 Score <4 Follow-up Not Indicated     Goals Addressed             This Visit's Progress    To resume water exercises and eventually run a couple of 5k runs a year.  Objective:    Today's Vitals   10/10/24 1350  Weight: 183 lb (83 kg)  Height: 5' 9 (1.753 m)   Body mass index is 27.02 kg/m.  Hearing/Vision screen No results found. Immunizations and Health Maintenance Health Maintenance  Topic Date Due   Influenza Vaccine  01/23/2025 (Originally 05/26/2024)   COVID-19 Vaccine (5 - 2025-26 season) 10/10/2025 (Originally 06/26/2024)   DTaP/Tdap/Td (3 - Td or Tdap) 12/30/2024   Colonoscopy  10/09/2025   Medicare Annual Wellness (AWV)  10/10/2025   Pneumococcal Vaccine: 50+ Years  Completed   Hepatitis C Screening  Completed   Zoster Vaccines- Shingrix   Completed   Meningococcal B Vaccine  Aged Out   Hepatitis B Vaccines 19-59 Average Risk  Discontinued         Assessment/Plan:  This is a routine wellness examination for Christian Mcgrath.  Patient Care Team: Amon Aloysius BRAVO, MD as PCP - General  I have personally reviewed and noted the following in the patients chart:   Medical and social history Use of alcohol, tobacco or illicit drugs  Current medications and supplements including opioid prescriptions. Functional ability and status Nutritional status Physical activity Advanced directives List of other physicians Hospitalizations, surgeries, and ER visits in previous 12 months Vitals Screenings to include cognitive, depression, and falls Referrals and appointments  No orders of the defined types were placed in this encounter.  In addition, I have reviewed and discussed with patient certain preventive protocols, quality metrics, and best practice recommendations. A written personalized care plan for preventive services as well as general preventive health recommendations were provided to patient.   Lolita Libra, CMA   10/10/2024   Return in 1 year (on 10/10/2025).  After Visit Summary: (MyChart) Due to this being a telephonic visit, the after visit summary with patients personalized plan was offered to patient via MyChart   Nurse Notes: Appointment(s) made: (Flu vaccine and AWV).

## 2024-10-10 NOTE — Patient Instructions (Addendum)
 Christian Mcgrath,  Thank you for taking the time for your Medicare Wellness Visit. I appreciate your continued commitment to your health goals. Please review the care plan we discussed, and feel free to reach out if I can assist you further.  Please note that Annual Wellness Visits do not include a physical exam. Some assessments may be limited, especially if the visit was conducted virtually. If needed, we may recommend an in-person follow-up with your provider.  GOAL:  To begin water exercises and run a couple of 5Ks  Ongoing Care Seeing your primary care provider every 3 to 6 months helps us  monitor your health and provide consistent, personalized care.   Nurse Visit (Flu vaccine): 10/11/24 9:30am Dr Amon:  11/27/24 1:40pm Medicare AWV: 10/11/25 9:40am, telephone  Recommended Screenings:  Health Maintenance  Topic Date Due   COVID-19 Vaccine (5 - 2025-26 season) 06/26/2024   Medicare Annual Wellness Visit  10/07/2024   Flu Shot  01/23/2025*   DTaP/Tdap/Td vaccine (3 - Td or Tdap) 12/30/2024   Colon Cancer Screening  10/09/2025   Pneumococcal Vaccine for age over 54  Completed   Hepatitis C Screening  Completed   Zoster (Shingles) Vaccine  Completed   Meningitis B Vaccine  Aged Out   Hepatitis B Vaccine  Discontinued  *Topic was postponed. The date shown is not the original due date.       10/10/2024    1:53 PM  Advanced Directives  Does Patient Have a Medical Advance Directive? No  Would patient like information on creating a medical advance directive? Yes (MAU/Ambulatory/Procedural Areas - Information given)  Please let me know if you do not receive your Advanced Directive Packet within 1 week. Once completed and notarized, you may return a copy of your Advanced Directive(s) by either of the following:  Bring a copy of your health care power of attorney and living will to the office to be added to your chart at your convenience. You can mail a copy to [facility] [address] [place], [place] [address] or email to [email or web address]    Vision: Annual vision screenings are recommended for early detection of glaucoma, cataracts, and diabetic retinopathy. These exams can also reveal signs of chronic conditions such as diabetes and high blood pressure.  Dental: Annual dental screenings help detect early signs of oral cancer, gum disease, and other conditions linked to overall health, including heart disease and diabetes.  Please see the attached documents for additional preventive care recommendations.

## 2024-10-11 ENCOUNTER — Ambulatory Visit

## 2024-10-11 DIAGNOSIS — Z23 Encounter for immunization: Secondary | ICD-10-CM | POA: Diagnosis not present

## 2024-10-11 NOTE — Progress Notes (Signed)
 Patient is here for a flu shot. Given in Right Deltoid Patient Tolerated well.

## 2024-11-27 ENCOUNTER — Ambulatory Visit: Admitting: Internal Medicine

## 2024-12-20 ENCOUNTER — Ambulatory Visit: Admitting: Internal Medicine

## 2025-10-11 ENCOUNTER — Ambulatory Visit
